# Patient Record
Sex: Female | Born: 1975 | Race: White | Hispanic: No | Marital: Married | Smoking: Never smoker
Health system: Southern US, Community
[De-identification: ages and names within clinical notes are randomized; demographics above are authoritative.]

## PROBLEM LIST (undated history)

## (undated) DIAGNOSIS — G8929 Other chronic pain: Secondary | ICD-10-CM

## (undated) DIAGNOSIS — Z91018 Allergy to other foods: Secondary | ICD-10-CM

## (undated) DIAGNOSIS — E785 Hyperlipidemia, unspecified: Secondary | ICD-10-CM

## (undated) DIAGNOSIS — E7800 Pure hypercholesterolemia, unspecified: Secondary | ICD-10-CM

## (undated) DIAGNOSIS — H8109 Meniere's disease, unspecified ear: Secondary | ICD-10-CM

## (undated) DIAGNOSIS — M7989 Other specified soft tissue disorders: Secondary | ICD-10-CM

## (undated) DIAGNOSIS — G4730 Sleep apnea, unspecified: Secondary | ICD-10-CM

## (undated) DIAGNOSIS — D649 Anemia, unspecified: Secondary | ICD-10-CM

## (undated) DIAGNOSIS — R519 Headache, unspecified: Secondary | ICD-10-CM

## (undated) HISTORY — DX: Meniere's disease, unspecified ear: H81.09

## (undated) HISTORY — DX: Pure hypercholesterolemia, unspecified: E78.00

## (undated) HISTORY — DX: Allergy to other foods: Z91.018

## (undated) HISTORY — DX: Anemia, unspecified: D64.9

## (undated) HISTORY — DX: Headache, unspecified: R51.9

## (undated) HISTORY — DX: Other specified soft tissue disorders: M79.89

## (undated) HISTORY — DX: Hyperlipidemia, unspecified: E78.5

## (undated) HISTORY — DX: Other chronic pain: G89.29

## (undated) HISTORY — PX: WISDOM TOOTH EXTRACTION: SHX21

## (undated) HISTORY — DX: Sleep apnea, unspecified: G47.30

---

## 2002-11-16 ENCOUNTER — Other Ambulatory Visit: Admission: RE | Admit: 2002-11-16 | Discharge: 2002-11-16 | Payer: Self-pay | Admitting: Gynecology

## 2005-02-27 ENCOUNTER — Emergency Department (HOSPITAL_COMMUNITY): Admission: EM | Admit: 2005-02-27 | Discharge: 2005-02-27 | Payer: Self-pay | Admitting: Emergency Medicine

## 2006-08-15 ENCOUNTER — Encounter: Admission: RE | Admit: 2006-08-15 | Discharge: 2006-08-15 | Payer: Self-pay | Admitting: Internal Medicine

## 2009-07-14 ENCOUNTER — Ambulatory Visit: Payer: Self-pay | Admitting: Internal Medicine

## 2011-02-26 ENCOUNTER — Encounter: Payer: Self-pay | Admitting: Internal Medicine

## 2014-05-17 ENCOUNTER — Other Ambulatory Visit: Payer: 59 | Admitting: Internal Medicine

## 2014-05-17 DIAGNOSIS — Z0000 Encounter for general adult medical examination without abnormal findings: Secondary | ICD-10-CM

## 2014-05-17 DIAGNOSIS — Z1329 Encounter for screening for other suspected endocrine disorder: Secondary | ICD-10-CM

## 2014-05-17 DIAGNOSIS — Z13220 Encounter for screening for lipoid disorders: Secondary | ICD-10-CM

## 2014-05-17 DIAGNOSIS — Z1321 Encounter for screening for nutritional disorder: Secondary | ICD-10-CM

## 2014-05-17 DIAGNOSIS — Z130 Encounter for screening for diseases of the blood and blood-forming organs and certain disorders involving the immune mechanism: Secondary | ICD-10-CM

## 2014-05-17 LAB — COMPREHENSIVE METABOLIC PANEL
ALBUMIN: 3.9 g/dL (ref 3.5–5.2)
ALK PHOS: 80 U/L (ref 39–117)
ALT: 21 U/L (ref 0–35)
AST: 21 U/L (ref 0–37)
BUN: 12 mg/dL (ref 6–23)
CALCIUM: 9.2 mg/dL (ref 8.4–10.5)
CHLORIDE: 102 meq/L (ref 96–112)
CO2: 29 mEq/L (ref 19–32)
CREATININE: 0.71 mg/dL (ref 0.50–1.10)
GLUCOSE: 79 mg/dL (ref 70–99)
POTASSIUM: 4.2 meq/L (ref 3.5–5.3)
Sodium: 137 mEq/L (ref 135–145)
TOTAL PROTEIN: 7.2 g/dL (ref 6.0–8.3)
Total Bilirubin: 1.3 mg/dL — ABNORMAL HIGH (ref 0.2–1.2)

## 2014-05-17 LAB — LIPID PANEL
CHOL/HDL RATIO: 5.4 ratio
CHOLESTEROL: 199 mg/dL (ref 0–200)
HDL: 37 mg/dL — ABNORMAL LOW (ref >39)
LDL CALC: 139 mg/dL — AB (ref 0–99)
Triglycerides: 116 mg/dL (ref <150)
VLDL: 23 mg/dL (ref 0–40)

## 2014-05-17 LAB — CBC WITH DIFFERENTIAL/PLATELET
BASOS ABS: 0 10*3/uL (ref 0.0–0.1)
Basophils Relative: 0 % (ref 0–1)
EOS PCT: 1 % (ref 0–5)
Eosinophils Absolute: 0.1 10*3/uL (ref 0.0–0.7)
HEMATOCRIT: 40.6 % (ref 36.0–46.0)
Hemoglobin: 13.1 g/dL (ref 12.0–15.0)
LYMPHS ABS: 2.4 10*3/uL (ref 0.7–4.0)
Lymphocytes Relative: 32 % (ref 12–46)
MCH: 30 pg (ref 26.0–34.0)
MCHC: 32.3 g/dL (ref 30.0–36.0)
MCV: 92.9 fL (ref 78.0–100.0)
MPV: 9.5 fL (ref 8.6–12.4)
Monocytes Absolute: 0.5 10*3/uL (ref 0.1–1.0)
Monocytes Relative: 6 % (ref 3–12)
NEUTROS PCT: 61 % (ref 43–77)
Neutro Abs: 4.6 10*3/uL (ref 1.7–7.7)
PLATELETS: 306 10*3/uL (ref 150–400)
RBC: 4.37 MIL/uL (ref 3.87–5.11)
RDW: 13.5 % (ref 11.5–15.5)
WBC: 7.6 10*3/uL (ref 4.0–10.5)

## 2014-05-18 LAB — TSH: TSH: 2.372 u[IU]/mL (ref 0.350–4.500)

## 2014-05-18 LAB — VITAMIN D 25 HYDROXY (VIT D DEFICIENCY, FRACTURES): Vit D, 25-Hydroxy: 25 ng/mL — ABNORMAL LOW (ref 30–100)

## 2014-05-19 ENCOUNTER — Encounter: Payer: Self-pay | Admitting: Internal Medicine

## 2014-05-19 ENCOUNTER — Ambulatory Visit (INDEPENDENT_AMBULATORY_CARE_PROVIDER_SITE_OTHER): Payer: 59 | Admitting: Internal Medicine

## 2014-05-19 VITALS — BP 128/84 | HR 83 | Temp 98.7°F | Wt 233.0 lb

## 2014-05-19 DIAGNOSIS — E7800 Pure hypercholesterolemia, unspecified: Secondary | ICD-10-CM

## 2014-05-19 DIAGNOSIS — E559 Vitamin D deficiency, unspecified: Secondary | ICD-10-CM | POA: Diagnosis not present

## 2014-05-19 DIAGNOSIS — R0683 Snoring: Secondary | ICD-10-CM

## 2014-05-19 DIAGNOSIS — Z23 Encounter for immunization: Secondary | ICD-10-CM

## 2014-05-19 MED ORDER — TETANUS-DIPHTH-ACELL PERTUSSIS 5-2.5-18.5 LF-MCG/0.5 IM SUSP
0.5000 mL | Freq: Once | INTRAMUSCULAR | Status: AC
  Administered 2014-05-19: 0.5 mL via INTRAMUSCULAR

## 2014-06-13 ENCOUNTER — Encounter: Payer: Self-pay | Admitting: Internal Medicine

## 2014-06-13 NOTE — Patient Instructions (Signed)
Take 2000 units vitamin D 3 daily for vitamin D deficiency. Watch diet and exercise. Repeat lipid panel in one year.

## 2014-06-13 NOTE — Progress Notes (Signed)
   Subjective:    Patient ID: Kara Wilkinson, female    DOB: [birth year], 39 y.o.   MRN: [id number]  HPI  39 year old white female, formerly known to this office as Kara Wilkinson, presents to the office for the first time today since 2011. She has been seeing OB/GYN nurse practitioner, Sherolyn Buba. She has an IUD.  Amoxicillin causes hives. Nickel causes a rash.  No pregnancies.  No history of serious illnesses accidents or operations. No hospitalizations.  Social history: She is married to [name] none. 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 Does not smoke or consume alcohol. Husband has history of end-stage renal disease and has undergone transplant with kidney and pancreas.  Family history: Father age 38 with history of end-stage renal disease, diabetes, macular degeneration, stroke. Mother in good health. One sister age 90 with history of lupus and narcolepsy. One sister age 16 with arthritis.  Initially presented to the office in November 4445 is a 39 year old senior at [employer]. Has been seen mostly for respiratory infections through the years. Seen for for vasovagal syncope in April 1998.  History of fractured left index finger 1990.      Review of Systems  Constitutional: Negative.   HENT: Negative.   Eyes: Negative.   Respiratory: Negative.   Cardiovascular: Negative.   Genitourinary:       Menarche at age 57. Has IUD  Neurological: Negative.   Hematological: Negative.   Psychiatric/Behavioral: Negative.        Objective:   Physical Exam  Constitutional: She is oriented to person, place, and time. She appears well-developed and well-nourished. No distress.  HENT:  Head: Normocephalic and atraumatic.  Right Ear: External ear normal.  Left Ear: External ear normal.  Nose: Nose normal.  Mouth/Throat: Oropharynx is clear and moist. No oropharyngeal exudate.  Eyes: Conjunctivae and  EOM are normal. Right eye exhibits no discharge. Left eye exhibits no discharge. No scleral icterus.  Neck: Neck supple. No JVD present. No tracheal deviation present. No thyromegaly present.  Cardiovascular: Normal rate, normal heart sounds and intact distal pulses.   No murmur heard. Pulmonary/Chest: Breath sounds normal. No respiratory distress. She has no wheezes.  Abdominal: Soft. Bowel sounds are normal. She exhibits no distension and no mass. There is no tenderness. There is no rebound and no guarding.  Genitourinary:  Deferred to GYN  Musculoskeletal: She exhibits no edema.  Lymphadenopathy:    She has no cervical adenopathy.  Neurological: She is alert and oriented to person, place, and time. She has normal reflexes. No cranial nerve deficit.  Skin: No rash noted. She is not diaphoretic.  Psychiatric: She has a normal mood and affect. Her behavior is normal. Judgment and thought content normal.  Vitals reviewed.         Assessment & Plan:  Normal health maintenance exam  Vitamin D deficiency  Elevated LDL cholesterol  History of snoring-may have sleep evaluation with pulmonologist  Tetanus immunization update given today  Plan: Work on diet and exercise and recheck lipid panel in one year. Take 2000 units vitamin D 3 daily for vitamin D deficiency. Return in one year or as needed.

## 2014-06-14 ENCOUNTER — Encounter: Payer: Self-pay | Admitting: Internal Medicine

## 2014-07-13 ENCOUNTER — Institutional Professional Consult (permissible substitution): Payer: 59 | Admitting: Pulmonary Disease

## 2014-09-08 ENCOUNTER — Ambulatory Visit (INDEPENDENT_AMBULATORY_CARE_PROVIDER_SITE_OTHER): Payer: 59 | Admitting: Pulmonary Disease

## 2014-09-08 ENCOUNTER — Encounter: Payer: Self-pay | Admitting: Pulmonary Disease

## 2014-09-08 VITALS — BP 138/82 | HR 81 | Ht 65.0 in | Wt 239.0 lb

## 2014-09-08 DIAGNOSIS — G4710 Hypersomnia, unspecified: Secondary | ICD-10-CM | POA: Diagnosis not present

## 2014-09-08 NOTE — Patient Instructions (Signed)
Sleep study overnight followed by nap test

## 2014-09-08 NOTE — Progress Notes (Signed)
   Subjective:    Patient ID: Kara Wilkinson, female    DOB: [birth year], 39 y.o.   MRN: [id number]  HPI  39 year old referred for evaluation of sleep-disordered breathing. She reports excessive daytime somnolence Epworth sleepiness score is 19-she report sleepiness and radius social situations such as watching TV, passenger in a car or sitting inactive in a public place. Her dad has obstructive sleep apnea and an older sister has narcolepsy. No apneas have been witnessed by her husband, she has been told that she snores mildly. She will take a nap for a few hours on weekends. Bedtime is around 10:30 PM, sleep latency is minimal, sleeps on her side with one pillow, reports one nocturnal awakening for nocturia without any post void sleep latency and is out of bed at 6 AM with headaches and occasional dryness of mouth. She's gained 15 pounds in the last 2 years. There is no history suggestive of cataplexy, sleep paralysis or parasomnias She works at [employer] as a Engineer, mining   History reviewed. No pertinent past medical history.  History reviewed. No pertinent past surgical history.  Allergies  Allergen Reactions  . Amoxicillin Hives    History   Social History  . Marital Status: Married    Spouse Name: N/A  . Number of Children: N/A  . Years of Education: N/A   Occupational History  . Not on file.   Social History Main Topics  . Smoking status: Never Smoker   . Smokeless tobacco: Not on file  . Alcohol Use: Not on file  . Drug Use: Not on file  . Sexual Activity: Not on file   Other Topics Concern  . Not on file   Social History Narrative    Family History  Problem Relation Age of Onset  . Kidney disease Father   . Diabetes Father     Review of Systems  Constitutional: Positive for fatigue and unexpected weight change.   Eyes: negative for irritation, redness and visual disturbance  Ears, nose, mouth, throat, and face: negative for earaches,  epistaxis, nasal congestion and sore throat  Respiratory: negative for cough, dyspnea on exertion, sputum and wheezing  Cardiovascular: negative for chest pain, dyspnea, lower extremity edema, orthopnea, palpitations and syncope  Gastrointestinal: negative for abdominal pain, constipation, diarrhea, melena, nausea and vomiting  Genitourinary:negative for dysuria, frequency and hematuria  Hematologic/lymphatic: negative for bleeding, easy bruising and lymphadenopathy  Musculoskeletal:negative for arthralgias, muscle weakness and stiff joints  Neurological: negative for coordination problems, gait problems, headaches and weakness  Endocrine: negative for diabetic symptoms including polydipsia, polyuria and weight loss      Objective:   Physical Exam  Gen. Pleasant, obese, in no distress, normal affect ENT - no lesions, no post nasal drip, class 2-3 airway Neck: No JVD, no thyromegaly, no carotid bruits Lungs: no use of accessory muscles, no dullness to percussion, decreased without rales or rhonchi  Cardiovascular: Rhythm regular, heart sounds  normal, no murmurs or gallops, no peripheral edema Abdomen: soft and non-tender, no hepatosplenomegaly, BS normal. Musculoskeletal: No deformities, no cyanosis or clubbing Neuro:  alert, non focal, no tremors       Assessment & Plan:

## 2014-09-08 NOTE — Assessment & Plan Note (Addendum)
Given excessive daytime somnolence, narrow pharyngeal exam, witnessed apneas & loud snoring, obstructive sleep apnea is very likely & an overnight polysomnogram will be scheduled as a split study. The pathophysiology of obstructive sleep apnea , it's cardiovascular consequences & modes of treatment including CPAP were discused with the patient in detail & they evidenced understanding. Given family h/o narcolepsy & h/o vivid dreams even with naps - will also plan for MSLT after.  She does not seem to have cataplexy or sleep paralysis

## 2014-11-07 ENCOUNTER — Telehealth: Payer: Self-pay | Admitting: Pulmonary Disease

## 2014-11-07 NOTE — Telephone Encounter (Signed)
Spoke with [facility] Need to know if need for sleep study is for DOT purposes - NO Does the patient have any cardiovascular issues being the reason why split night and MSLT was ordered? - NO, nothing mentioned in chart.  Per [facility] rep, the patient will need a HST first - no pre-cert required. This is required before any other sleep test can be done based on her insurance and medical history documented.  Will send to Dr Elsworth Soho as Juluis Rainier.  Please advise. Thanks.

## 2014-11-07 NOTE — Telephone Encounter (Signed)
Given family h/o narcolepsy & h/o vivid dreams even with naps - will also plan for MSLT  Pl let [employer] rep know -concern here is for narcolepsy - so prefer in lab study

## 2014-11-07 NOTE — Telephone Encounter (Signed)
Return call.Stanley A Dalton °

## 2014-11-07 NOTE — Telephone Encounter (Signed)
lmtcb

## 2014-11-10 NOTE — Telephone Encounter (Signed)
TE has been closed ......................................... lmtcb.

## 2014-11-20 ENCOUNTER — Ambulatory Visit (HOSPITAL_BASED_OUTPATIENT_CLINIC_OR_DEPARTMENT_OTHER): Payer: 59 | Attending: Pulmonary Disease | Admitting: Radiology

## 2014-11-20 VITALS — Ht 65.0 in | Wt 234.0 lb

## 2014-11-20 DIAGNOSIS — R0683 Snoring: Secondary | ICD-10-CM | POA: Diagnosis not present

## 2014-11-20 DIAGNOSIS — G4710 Hypersomnia, unspecified: Secondary | ICD-10-CM

## 2014-11-20 DIAGNOSIS — G4733 Obstructive sleep apnea (adult) (pediatric): Secondary | ICD-10-CM | POA: Diagnosis not present

## 2014-11-20 DIAGNOSIS — I493 Ventricular premature depolarization: Secondary | ICD-10-CM | POA: Diagnosis not present

## 2014-11-20 DIAGNOSIS — R400 Somnolence: Secondary | ICD-10-CM | POA: Diagnosis present

## 2014-11-21 ENCOUNTER — Encounter (HOSPITAL_BASED_OUTPATIENT_CLINIC_OR_DEPARTMENT_OTHER): Payer: 59

## 2014-11-23 ENCOUNTER — Telehealth: Payer: Self-pay | Admitting: Pulmonary Disease

## 2014-11-23 DIAGNOSIS — G4733 Obstructive sleep apnea (adult) (pediatric): Secondary | ICD-10-CM

## 2014-11-23 DIAGNOSIS — G4730 Sleep apnea, unspecified: Secondary | ICD-10-CM | POA: Diagnosis not present

## 2014-11-23 NOTE — Progress Notes (Signed)
Patient Name: Kara Wilkinson, Kara Wilkinson Date: 11/20/2014 Gender: Female D.O.B: 05-21-1975 Age (years): 71 Referring Provider: Kara Mead MD, ABSM Height (inches): 65 Interpreting Physician: Kara Mead MD, ABSM Weight (lbs): 234 RPSGT: Zadie Rhine BMI: 39 MRN: [id number] Neck Size: 14.50  CLINICAL INFORMATION Sleep Study Type: Split Night CPAP  Indication for sleep study: Excessive daytime somnolence, loud snoring and witnessed apneas  Epworth Sleepiness Score: 19  SLEEP STUDY TECHNIQUE As per the AASM Manual for the Scoring of Sleep and Associated Events v2.3 (April 2016) with a hypopnea requiring 4% desaturations.  The channels recorded and monitored were frontal, central and occipital EEG, electrooculogram (EOG), submentalis EMG (chin), nasal and oral airflow, thoracic and abdominal wall motion, anterior tibialis EMG, snore microphone, electrocardiogram, and pulse oximetry. Continuous positive airway pressure (CPAP) was initiated when the patient met split night criteria and was titrated according to treat sleep-disordered breathing.  MEDICATIONS Medications administered by patient during sleep study : No sleep medicine administered. RESPIRATORY PARAMETERS Diagnostic  Total AHI (/hr): 108.7 RDI (/hr): 113.8 OA Index (/hr): 52.1 CA Index (/hr): 0.6 REM AHI (/hr): 110.0 NREM AHI (/hr): 108.6 Supine AHI (/hr): 115.1 Non-supine AHI (/hr): 99.78 Min O2 Sat (%): 77.00 Mean O2 (%): 92.92 Time below 88% (min): 7.8   Titration  Optimal Pressure (cm): 10 AHI at Optimal Pressure (/hr): N/A Min O2 at Optimal Pressure (%): 89.00 Supine % at Optimal (%): N/A Sleep % at Optimal (%): N/A    SLEEP ARCHITECTURE The recording time for the entire night was 381.5 minutes.  During a baseline period of 127.2 minutes, the patient slept for 106.0 minutes in REM and nonREM, yielding a sleep efficiency of 83.4%. Sleep onset after lights out was 6.4 minutes with a REM latency of 56.0 minutes. The  patient spent 4.25% of the night in stage N1 sleep, 90.09% in stage N2 sleep, 0.00% in stage N3 and 5.66% in REM.  During the titration period of 251.8 minutes, the patient slept for 238.0 minutes in REM and nonREM, yielding a sleep efficiency of 94.5%. Sleep onset after CPAP initiation was 11.2 minutes with a REM latency of 16.0 minutes. The patient spent 0.84% of the night in stage N1 sleep, 24.58% in stage N2 sleep, 35.92% in stage N3 and 38.66% in REM.  CARDIAC DATA The 2 lead EKG demonstrated sinus rhythm. The mean heart rate was 64.75 beats per minute. Other EKG findings include: PVCs.  LEG MOVEMENT DATA The total Periodic Limb Movements of Sleep (PLMS) were 0. The PLMS index was 0.00 .  IMPRESSIONS Severe obstructive sleep apnea occurred during the diagnostic portion of the study (AHI = 108.7/hour). An optimal PAP pressure was selected for this patient ( 10 cm of water) No significant central sleep apnea occurred during the diagnostic portion of the study (CAI = 0.6/hour). Mild oxygen desaturation was noted during the diagnostic portion of the study (Min O2 = 77.00%). The patient snored with Soft snoring volume during the diagnostic portion of the study. EKG findings include PVCs. Clinically significant periodic limb movements did not occur during sleep.   DIAGNOSIS Obstructive Sleep Apnea (327.23 [G47.33 ICD-10])   RECOMMENDATIONS Trial of CPAP therapy on 10 cm H2O with a Small size Resmed Full Face Mask Quattro FX mask and heated humidification. Avoid alcohol, sedatives and other CNS depressants that may worsen sleep apnea and disrupt normal sleep architecture. Sleep hygiene should be reviewed to assess factors that may improve sleep quality. Weight management and regular exercise should be initiated or continued. Return  for re-evaluation after 4 weeks of therapy    Kara Mead MD. FCCP. [facility]

## 2014-11-23 NOTE — Telephone Encounter (Signed)
PSG showed severe OSA-AHI of 108/hour  Send prescription for - CPAP therapy on 10 cm H2O with a Small size Resmed Full Face Mask Quattro FX mask and heated humidification Download in 4 weeks  Schedule office visit in 6 weeks with TP/ me

## 2014-11-23 NOTE — Addendum Note (Signed)
Addended by: Rigoberto Noel on: 11/23/2014 05:40 PM   Modules accepted: Level of Service

## 2014-11-24 NOTE — Telephone Encounter (Signed)
lmtcb

## 2014-11-25 NOTE — Telephone Encounter (Signed)
lmtcb

## 2014-11-29 NOTE — Telephone Encounter (Signed)
[phone], pt cb

## 2014-11-29 NOTE — Telephone Encounter (Signed)
LMTCB x 1 

## 2014-11-30 NOTE — Telephone Encounter (Signed)
lmtcb

## 2014-12-02 NOTE — Telephone Encounter (Signed)
Called spoke with pt. Aware of results. Order placed for CPAP. appt scheduled for 10/6 w/ RA.

## 2014-12-02 NOTE — Telephone Encounter (Signed)
lmtcb

## 2014-12-02 NOTE — Telephone Encounter (Signed)
Pt returning call and can be reached [phone].Kara Wilkinson

## 2014-12-02 NOTE — Telephone Encounter (Signed)
[phone] calling back

## 2014-12-02 NOTE — Telephone Encounter (Signed)
lmomtcb x1 

## 2015-01-19 ENCOUNTER — Ambulatory Visit: Payer: 59 | Admitting: Pulmonary Disease

## 2015-05-22 ENCOUNTER — Other Ambulatory Visit: Payer: 59 | Admitting: Internal Medicine

## 2015-05-23 ENCOUNTER — Encounter: Payer: 59 | Admitting: Internal Medicine

## 2017-05-11 ENCOUNTER — Emergency Department (HOSPITAL_BASED_OUTPATIENT_CLINIC_OR_DEPARTMENT_OTHER)
Admission: EM | Admit: 2017-05-11 | Discharge: 2017-05-11 | Disposition: A | Discharge status: Home / Self Care | Payer: 59 | Attending: Emergency Medicine | Admitting: Emergency Medicine

## 2017-05-11 ENCOUNTER — Other Ambulatory Visit: Payer: Self-pay

## 2017-05-11 ENCOUNTER — Emergency Department (HOSPITAL_BASED_OUTPATIENT_CLINIC_OR_DEPARTMENT_OTHER): Payer: 59

## 2017-05-11 ENCOUNTER — Encounter (HOSPITAL_BASED_OUTPATIENT_CLINIC_OR_DEPARTMENT_OTHER): Payer: Self-pay | Admitting: Adult Health

## 2017-05-11 DIAGNOSIS — Z79899 Other long term (current) drug therapy: Secondary | ICD-10-CM | POA: Insufficient documentation

## 2017-05-11 DIAGNOSIS — S80212A Abrasion, left knee, initial encounter: Secondary | ICD-10-CM | POA: Diagnosis not present

## 2017-05-11 DIAGNOSIS — Y9301 Activity, walking, marching and hiking: Secondary | ICD-10-CM | POA: Diagnosis not present

## 2017-05-11 DIAGNOSIS — W108XXA Fall (on) (from) other stairs and steps, initial encounter: Secondary | ICD-10-CM | POA: Diagnosis not present

## 2017-05-11 DIAGNOSIS — S80211A Abrasion, right knee, initial encounter: Secondary | ICD-10-CM | POA: Diagnosis not present

## 2017-05-11 DIAGNOSIS — Y9222 Religious institution as the place of occurrence of the external cause: Secondary | ICD-10-CM | POA: Diagnosis not present

## 2017-05-11 DIAGNOSIS — Y998 Other external cause status: Secondary | ICD-10-CM | POA: Diagnosis not present

## 2017-05-11 DIAGNOSIS — M79641 Pain in right hand: Secondary | ICD-10-CM | POA: Insufficient documentation

## 2017-05-11 DIAGNOSIS — S93401A Sprain of unspecified ligament of right ankle, initial encounter: Secondary | ICD-10-CM | POA: Diagnosis not present

## 2017-05-11 DIAGNOSIS — S99911A Unspecified injury of right ankle, initial encounter: Secondary | ICD-10-CM | POA: Diagnosis present

## 2017-05-11 DIAGNOSIS — W19XXXA Unspecified fall, initial encounter: Secondary | ICD-10-CM

## 2017-05-11 MED ORDER — METHOCARBAMOL 500 MG PO TABS: 500.0000 mg | ORAL_TABLET | Freq: Two times a day (BID) | ORAL | 0 refills | Status: DC

## 2017-05-11 MED ORDER — NAPROXEN 500 MG PO TABS: 500.0000 mg | ORAL_TABLET | Freq: Two times a day (BID) | ORAL | 0 refills | Status: DC

## 2017-05-11 MED ORDER — IBUPROFEN 400 MG PO TABS
600.0000 mg | ORAL_TABLET | Freq: Once | ORAL | Status: AC
  Administered 2017-05-11: 600 mg via ORAL
  Filled 2017-05-11: qty 1

## 2017-05-11 NOTE — ED Triage Notes (Signed)
PResents post fall down 7 stairs at church this AM. C/o pain to tright ankle and right hand. RIght ankle swollen, CMS intact. PAin is worse on the back of heel and ankle.

## 2017-05-11 NOTE — Discharge Instructions (Signed)
Your x-rays show no evidence of acute fracture.  You may need to use Naprosyn and Tylenol for pain, you may have more muscle soreness tomorrow, if you have muscle spasm, you may use Robaxin, this medication can make you a bit drowsy.  Keep ankle elevated as much as possible, use crutches and brace.  Use splint for finger to help prevent you from bending it.  If pain is not improving after a few days of this treatment please follow-up with your primary care doctor as well as Dr. Karlton Lemon here with sports medicine.  If you have significantly worsening pain, numbness or tingling in any of your extremities or other new or concerning symptoms please return to the ED for reevaluation.

## 2017-05-11 NOTE — ED Provider Notes (Signed)
[facility] EMERGENCY DEPARTMENT Provider Note   CSN: [id number] Arrival date & time: 05/11/17  1140     History   Chief Complaint Chief Complaint  Patient presents with  . Fall    HPI Kara Wilkinson is a 42 y.o. female.  Kara Wilkinson is a 42 y.o. Female who is otherwise healthy, presents to the ED after mechanical fall this morning.  Patient reports that she missed a step, and fell down 7 stairs at church this morning. Patient denies hitting her head, no loss of consciousness, headache, dizziness, nausea, vomiting or neck pain.  Patient complaining primarily of right ankle pain and pain over the right index finger extending into the hand.  Patient reports abrasions to both knees, but needs otherwise unremarkable.  Patient denies any pain at the hips, no pain over the shoulders, elbow or wrist.  Patient denies any chest pain, shortness of breath or pain with deep breath, no abdominal pain.  Patient has been ambulatory since falling, but reports pain with weightbearing on the right ankle.  Patient denies any back pain, numbness, tingling or weakness of any of her extremities.       History reviewed. No pertinent past medical history.  Patient Active Problem List   Diagnosis Date Noted  . Hypersomnolence 09/08/2014    History reviewed. No pertinent surgical history.  OB History    No data available       Home Medications    Prior to Admission medications   Medication Sig Start Date End Date Taking? Authorizing Provider  levonorgestrel (MIRENA) 20 MCG/24HR IUD 1 each by Intrauterine route once.    [provider]  methocarbamol (ROBAXIN) 500 MG tablet Take 1 tablet (500 mg total) by mouth 2 (two) times daily. 05/11/17   Jacqlyn Larsen, PA-C  naproxen (NAPROSYN) 500 MG tablet Take 1 tablet (500 mg total) by mouth 2 (two) times daily. 05/11/17   Jacqlyn Larsen, PA-C    Family History Family History  Problem Relation Age of Onset  . Kidney disease  Father   . Diabetes Father     Social History Social History   Tobacco Use  . Smoking status: Never Smoker  Substance Use Topics  . Alcohol use: Not on file  . Drug use: Not on file     Allergies   Amoxicillin   Review of Systems Review of Systems  Constitutional: Negative for chills, fatigue and fever.  HENT: Negative for ear pain and facial swelling.   Eyes: Negative for photophobia and visual disturbance.  Respiratory: Negative for chest tightness and shortness of breath.   Cardiovascular: Negative for chest pain and palpitations.  Gastrointestinal: Negative for abdominal distention, abdominal pain, nausea and vomiting.  Genitourinary: Negative for flank pain.  Musculoskeletal: Positive for arthralgias, joint swelling and myalgias. Negative for back pain, neck pain and neck stiffness.  Skin: Positive for wound (Bilateral knee abrasions). Negative for rash.  Neurological: Negative for dizziness, weakness, light-headedness, numbness and headaches.     Physical Exam Updated Vital Signs BP (!) 145/86 (BP Location: Left Arm)   Pulse 79   Temp 98.7 F (37.1 C) (Oral)   Resp 20   Wt 106.1 kg (234 lb)   LMP 05/08/2017   SpO2 100%   BMI 38.94 kg/m   Physical Exam  Constitutional: She appears well-developed and well-nourished. No distress.  HENT:  Head: Normocephalic and atraumatic.  Eyes: Right eye exhibits no discharge. Left eye exhibits no discharge.  Neck: Normal  range of motion. Neck supple.  C-spine nontender to palpation midline or paraspinally, normal active range of motion  Cardiovascular: Normal rate, regular rhythm, normal heart sounds and intact distal pulses.  Pulmonary/Chest: Effort normal and breath sounds normal. No stridor. No respiratory distress. She has no wheezes. She has no rales. She exhibits no tenderness.  Abdominal: Soft. Bowel sounds are normal. She exhibits no distension. There is no tenderness.  Musculoskeletal:  Right ankle with pain  and swelling over the lateral malleolus, nontender over the medial malleolus, no obvious deformity, dorsi and plantar flexion limited by pain, 2+ DP and TP pulses, sensation intact. Left ankle with minimal pain and swelling over the lateral malleolus, no pain elsewhere, normal dorsi and plantarflexion, DP and TP pulses 2+ and sensation intact. Bilateral knees with small abrasion, normal flexion and extension, nontender to palpation. Right hand with some tenderness over the index finger and MTP joint, no palpable deformity, normal flexion and extension slightly limited by pain, good capillary refill, 2+ radial pulse, normal wrist range of motion and sensation intact.  Shoulders and elbows unremarkable.  Neurological: She is alert. Coordination normal.  Skin: Skin is warm and dry. She is not diaphoretic.  Bilateral some surface abrasions to the knee, no other wounds or abrasions noted  Psychiatric: She has a normal mood and affect. Her behavior is normal.  Nursing note and vitals reviewed.    ED Treatments / Results  Labs (all labs ordered are listed, but only abnormal results are displayed) Labs Reviewed - No data to display  EKG  EKG Interpretation None       Radiology Dg Ankle Complete Right  Result Date: 05/11/2017 CLINICAL DATA:  [place] 7 stairs at church this morning, RIGHT ankle and RIGHT hand pain, swelling to entire ankle, pain in index finger radiating into first metacarpal region, initial encounter EXAM: RIGHT ANKLE - COMPLETE 3+ VIEW COMPARISON:  None FINDINGS: Diffuse soft tissue swelling. Ankle mortise intact. Osseous mineralization normal. No acute fracture, dislocation, or bone destruction. IMPRESSION: No acute osseous abnormalities. Electronically Signed   By: Lavonia Dana M.D.   On: 05/11/2017 12:33   Dg Hand Complete Right  Result Date: 05/11/2017 CLINICAL DATA:  [place] 7 stairs at church this morning, RIGHT ankle and RIGHT hand pain, swelling to entire ankle,  pain in index finger radiating into first metacarpal region, initial encounter EXAM: RIGHT HAND - COMPLETE 3+ VIEW COMPARISON:  None. FINDINGS: Osseous mineralization normal. Joint spaces preserved. No acute fracture, dislocation or bone destruction. IMPRESSION: Normal exam. Electronically Signed   By: Lavonia Dana M.D.   On: 05/11/2017 12:36    Procedures Procedures (including critical care time)  Medications Ordered in ED Medications  ibuprofen (ADVIL,MOTRIN) tablet 600 mg (600 mg Oral Given 05/11/17 1413)     Initial Impression / Assessment and Plan / ED Course  I have reviewed the triage vital signs and the nursing notes.  Pertinent labs & imaging results that were available during my care of the patient were reviewed by me and considered in my medical decision making (see chart for details).  Patient presents for evaluation after mechanical fall this morning.  No head trauma, no loss of consciousness.  Patient complaining primarily of right ankle and right hand pain.  Patient reports some generalized muscle soreness, abrasions to bilateral knees, but no other concerning injuries.  On exam patient is mildly hypertensive but vitals otherwise normal.  Right ankle with swelling over the lateral malleolus, neurovascularly intact and x-ray  without fracture, presentation suggestive of ankle sprain, will provide ASO brace and crutches.  Right hand with pain over the index finger and MTP joint, no obvious deformity, neurovascularly intact, x-ray without abnormality, likely ligamentous sprain, will place in splint for protection.  No evidence of other exam, typical muscle soreness after a fall, and abrasions to bilateral knees, with normal range of motion.  Ibuprofen for pain.  At this time patient is stable for discharge home, discussed rice therapy for injuries.  Also provided patient with small amount of muscle relaxer if she may have worsening soreness tomorrow.  Strict return precautions discussed.   Patient to follow-up with your primary care doctor, also provided referral to Dr. Karlton Lemon with sports medicine if pain in the ankle or hand persists.  Patient provided work note.  At this time patient has no further questions, expresses understanding and is in agreement with plan.  Final Clinical Impressions(s) / ED Diagnoses   Final diagnoses:  Fall, initial encounter  Sprain of right ankle, unspecified ligament, initial encounter  Right hand pain    ED Discharge Orders        Ordered    naproxen (NAPROSYN) 500 MG tablet  2 times daily     05/11/17 1421    methocarbamol (ROBAXIN) 500 MG tablet  2 times daily     05/11/17 1421       Jacqlyn Larsen, [place] 05/11/17 1751    Davonna Belling, MD 05/12/17 2313

## 2017-07-17 ENCOUNTER — Telehealth: Payer: Self-pay

## 2017-07-17 ENCOUNTER — Encounter: Payer: Self-pay | Admitting: Internal Medicine

## 2017-07-17 ENCOUNTER — Ambulatory Visit (INDEPENDENT_AMBULATORY_CARE_PROVIDER_SITE_OTHER): Payer: 59 | Admitting: Internal Medicine

## 2017-07-17 VITALS — BP 120/82 | HR 77 | Temp 98.2°F | Ht 65.0 in | Wt 260.0 lb

## 2017-07-17 DIAGNOSIS — L309 Dermatitis, unspecified: Secondary | ICD-10-CM | POA: Diagnosis not present

## 2017-07-17 DIAGNOSIS — R21 Rash and other nonspecific skin eruption: Secondary | ICD-10-CM | POA: Diagnosis not present

## 2017-07-17 MED ORDER — METHYLPREDNISOLONE ACETATE 80 MG/ML IJ SUSP
80.0000 mg | Freq: Once | INTRAMUSCULAR | Status: AC
  Administered 2017-07-17: 80 mg via INTRAMUSCULAR

## 2017-07-17 MED ORDER — HYDROXYZINE HCL 25 MG PO TABS: ORAL_TABLET | ORAL | 0 refills | Status: DC

## 2017-07-17 NOTE — Progress Notes (Signed)
   Subjective:    Patient ID: Kara Wilkinson, female    DOB: 1975/11/11, 42 y.o.   MRN: [id number]  HPI 42 year old Female with history itchy rash right dorsum of hand and arm for several weeks. No known bites, travel history, flea exposure or contact exposure to poison ivy or change in detergents creams, etc.  Tried Benadryl but it made her very sleepy. Has seen Dr. Crista Luria in the remote past.  Last seen here Feb 2016. Hx of nicklel causing a rash. Amoxicillin causes hives. No hx serious illness, accidents, or operations. Married works for [employer].    Review of Systems no recent viral illness like flu. No fever chills, N,V,D.     Objective:   Physical Exam She has tiny red papules on dorsum of hand and right arm with a vesicular center. Has some on LUE and legs as well. Pharynx is clear.A few on upper abdomen/ anterior chest. Area of linear erythema dorsum of hand.       Assessment & Plan:  Nonspecific dermatitis  Plan: Treat symptomatically with Atarax 25 -50 mg qid prn itching. Triamcinolone cream in 4 oz Eucerin cream to apply tid to rash. Refer to Dermatology to check for scabies etc if not improving.  Depo-Medrol 80 mg IM.

## 2017-07-17 NOTE — Telephone Encounter (Signed)
Book appt

## 2017-07-17 NOTE — Patient Instructions (Signed)
Depo-Medrol 80 mg IM.  Triamcinolone with Eucerin topically 3- 4 times a day.  Atarax 25 to  50 mg 3-4 times daily as needed for itching.  See dermatologist if not improving.

## 2017-07-17 NOTE — Telephone Encounter (Signed)
Patient called sates she has a rash on her hand she said it started as a bump and it has gotten worse and it's very itchy she would like to come in today. CB# [phone]

## 2018-05-22 ENCOUNTER — Encounter: Payer: Self-pay | Admitting: Internal Medicine

## 2018-05-22 ENCOUNTER — Ambulatory Visit (INDEPENDENT_AMBULATORY_CARE_PROVIDER_SITE_OTHER): Payer: 59 | Admitting: Internal Medicine

## 2018-05-22 VITALS — BP 110/88 | HR 74 | Temp 98.5°F | Ht 65.0 in | Wt 250.0 lb

## 2018-05-22 DIAGNOSIS — H6500 Acute serous otitis media, unspecified ear: Secondary | ICD-10-CM

## 2018-05-22 DIAGNOSIS — H8110 Benign paroxysmal vertigo, unspecified ear: Secondary | ICD-10-CM | POA: Diagnosis not present

## 2018-05-22 DIAGNOSIS — H9319 Tinnitus, unspecified ear: Secondary | ICD-10-CM

## 2018-05-22 MED ORDER — ALPRAZOLAM 0.25 MG PO TABS: 0.2500 mg | ORAL_TABLET | Freq: Two times a day (BID) | ORAL | 0 refills | Status: DC | PRN

## 2018-05-22 MED ORDER — AZITHROMYCIN 250 MG PO TABS: ORAL_TABLET | ORAL | 0 refills | Status: DC

## 2018-06-13 NOTE — Patient Instructions (Signed)
Take Zithromax Z-PAK as directed.  Take Xanax up to twice daily as needed for dizziness.  Call if not better in 7 to 10 days or sooner if worse.

## 2018-06-13 NOTE — Progress Notes (Signed)
   Subjective:    Patient ID: Winfield Rast, female    DOB: 11-28-1975, 43 y.o.   MRN: [id number]  HPI 43 year old Female in today with complaint of tinnitus and ear fullness.  She has been dizzy recently.  No history of serious illnesses accidents or operations.  She is married and works for [employer].  She is allergic to Amoxicillin- it causes hives.  History of vasovagal syncope April 1998.  Husband with history of end-stage renal disease underwent transplant with kidney and pancreas.  Seen here infrequently.  Sees OB/GYN nurse practitioner.  She has an IUD.  See dictation May 19, 2014  Complains of ear pressure.    Review of Systems main complaint is tinnitus but is also felt dizzy.  Denies fever chills myalgias or sore throat.     Objective:   Physical Exam No nystagmus is demonstrated.  PERRLA.  Pharynx is clear.  TMs are full bilaterally and slightly pink.  Neck is supple without adenopathy.  Brief neurological exam shows no focal deficits.       Assessment & Plan:  Benign positional vertigo-triggered by bilateral serous otitis media  Tinnitus-triggered by bilateral serous otitis media  Bilateral serous otitis media  Plan: Zithromax Z-PAK take 2 tablets day 1 followed by 1 tablet days 2 through 5.  Xanax 0.25 mg twice daily as needed.

## 2019-03-19 ENCOUNTER — Encounter: Payer: Self-pay | Admitting: Internal Medicine

## 2019-03-19 ENCOUNTER — Other Ambulatory Visit: Payer: Self-pay

## 2019-03-19 ENCOUNTER — Ambulatory Visit (INDEPENDENT_AMBULATORY_CARE_PROVIDER_SITE_OTHER): Payer: 59 | Admitting: Internal Medicine

## 2019-03-19 DIAGNOSIS — Z23 Encounter for immunization: Secondary | ICD-10-CM | POA: Diagnosis not present

## 2019-03-19 NOTE — Progress Notes (Signed)
Flu vaccine given by CMA 

## 2019-03-19 NOTE — Patient Instructions (Signed)
Patient received a flu vaccine IM R deltoid, AV, CMA  

## 2019-05-18 ENCOUNTER — Encounter: Payer: Self-pay | Admitting: Internal Medicine

## 2019-05-18 ENCOUNTER — Telehealth: Payer: Self-pay | Admitting: Internal Medicine

## 2019-05-18 ENCOUNTER — Other Ambulatory Visit: Payer: Self-pay

## 2019-05-18 ENCOUNTER — Ambulatory Visit (INDEPENDENT_AMBULATORY_CARE_PROVIDER_SITE_OTHER): Payer: 59 | Admitting: Internal Medicine

## 2019-05-18 VITALS — BP 136/97 | HR 87 | Temp 100.6°F | Ht 65.0 in | Wt 234.6 lb

## 2019-05-18 DIAGNOSIS — U071 COVID-19: Secondary | ICD-10-CM

## 2019-05-18 DIAGNOSIS — R519 Headache, unspecified: Secondary | ICD-10-CM | POA: Diagnosis not present

## 2019-05-18 DIAGNOSIS — J22 Unspecified acute lower respiratory infection: Secondary | ICD-10-CM

## 2019-05-18 DIAGNOSIS — B999 Unspecified infectious disease: Secondary | ICD-10-CM

## 2019-05-18 MED ORDER — PREDNISONE 10 MG PO TABS: ORAL_TABLET | ORAL | 0 refills | Status: DC

## 2019-05-18 MED ORDER — AZITHROMYCIN 250 MG PO TABS: ORAL_TABLET | ORAL | 0 refills | Status: DC

## 2019-05-18 MED ORDER — HYDROCODONE-HOMATROPINE 5-1.5 MG/5ML PO SYRP: 5.0000 mL | ORAL_SOLUTION | Freq: Three times a day (TID) | ORAL | 0 refills | Status: DC | PRN

## 2019-05-18 NOTE — Telephone Encounter (Signed)
Needs virtual visit 

## 2019-05-18 NOTE — Progress Notes (Signed)
   Subjective:    Patient ID: Kara Wilkinson, female    DOB: [birth year], 44 y.o.   MRN: [id number]  HPI 44 year old Female not seen here since February 2020 was diagnosed recently with Covid-19 at [facility]. Covid risk of complications score is 1- she has obesity.  Due to the coronavirus pandemic via interactive audio and video telecommunications.  She is identified using 2 identifiers as Kara Wilkinson, a patient in this practice.  She is agreeable to visit in this format today.  Patient says that she has been careful and has been socially distancing.  However she was just visit her sister who has history of lupus and brother-in-law recently.  Both of them have now come down with COVID-19.  The sister seldom leaves the house so she thinks brother-in-law may have contracted Covid at work.  Patient complaining of headache, myalgias, cough that is nonproductive.  No nausea.  Has decreased appetite.  Trying to stay hydrated.  I recommended patient monitor her temperature on a regular basis and have pulse oximeter device delivered to her doorstep so she can monitor pulse oximetry.  She seems to have a slight congested cough on virtual visit today.  Appetite is decreased.  No diarrhea.  No history of serious illnesses, accidents or hospitalizations. Does not smoke or consume alcohol.  Had G[facility] exam December 2020 at [facility]. New IUD was inserted then.   Review of Systems see above     Objective:   Physical Exam BMI 40.4 per GYN patient reports she is afebrile.  Currently does not have pulse oximeter.  Patient looks fatigued and is speaking slowly.  Heard  congested cough on video.  Seems to have malaise and fatigue.       Assessment & Plan:  Covid-19 infection  Obesity-BMI 41.60 in February 2020  Plan: For Covid risk of complication score is 1 related to obesity.  She will stay at home and quarantine.  She is going to be at home for 10 to 14 days.  I am calling in Zithromax  Z-PAK 2 p.o. day 1 followed by 1 p.o. days 2 through 5, prednisone 10 mg starting with 60 mg decreasing by 10 mg daily over 6 days.  Hycodan 1 teaspoon p.o. every 8 hours as needed cough.  This may also help headache and myalgias.  We will schedule another virtual visit in 48 hours.  She will call me sooner if symptoms worsen.

## 2019-05-18 NOTE — Telephone Encounter (Signed)
Scheduled appointment

## 2019-05-18 NOTE — Telephone Encounter (Signed)
Kara Wilkinson [phone]  Kara Wilkinson called to say she was diagnosed with COVID on Sunday from [place]. She has deen running fever between 99.9 to 102., it is now 101, she has cough and her chest is starting to get tight.

## 2019-05-18 NOTE — Patient Instructions (Signed)
We are sorry to hear you have COVID-19 infection.  Please get pulse oximeter and thermometer and monitor temp and pulse oximetry.  Call if pulse oximetry decreases to less than 95% or you have significant shortness of breath.  Take Zithromax and prednisone as directed and Hycodan sparingly for cough.  Hycodan will also help myalgias and headache.  Stay well-hydrated.  We will plan to speak again in 48 hours.  Please call sooner if you have concerns or worsening symptoms.

## 2019-07-24 ENCOUNTER — Ambulatory Visit: Payer: 59 | Attending: Internal Medicine

## 2019-07-24 DIAGNOSIS — Z23 Encounter for immunization: Secondary | ICD-10-CM

## 2019-07-24 NOTE — Progress Notes (Signed)
   Covid-19 Vaccination Clinic  Name:  Kara Wilkinson    MRN: [id number] DOB: Feb 08, 1976  07/24/2019  Kara Wilkinson was observed post Covid-19 immunization for 15 minutes without incident. She was provided with Vaccine Information Sheet and instruction to access the V-Safe system.   Kara Wilkinson was instructed to call 911 with any severe reactions post vaccine: Marland Kitchen Difficulty breathing  . Swelling of face and throat  . A fast heartbeat  . A bad rash all over body  . Dizziness and weakness   Immunizations Administered    Name Date Dose VIS Date Route   Pfizer COVID-19 Vaccine 07/24/2019  8:15 AM 0.3 mL 03/26/2019 Intramuscular   Manufacturer: [place]   Lot: [id number]   [place]: [id number]

## 2019-08-16 ENCOUNTER — Ambulatory Visit: Payer: 59

## 2019-08-25 ENCOUNTER — Ambulatory Visit: Payer: 59 | Attending: Internal Medicine

## 2019-08-25 DIAGNOSIS — Z23 Encounter for immunization: Secondary | ICD-10-CM

## 2019-08-25 NOTE — Progress Notes (Signed)
   Covid-19 Vaccination Clinic  Name:  KAILIE KUIPERS    MRN: [id number] DOB: 11-Sep-1975  08/25/2019  Ms. Killilea was observed post Covid-19 immunization for 15 minutes without incident. She was provided with Vaccine Information Sheet and instruction to access the V-Safe system.   Ms. Lippe was instructed to call 911 with any severe reactions post vaccine: Marland Kitchen Difficulty breathing  . Swelling of face and throat  . A fast heartbeat  . A bad rash all over body  . Dizziness and weakness   Immunizations Administered    Name Date Dose VIS Date Route   Pfizer COVID-19 Vaccine 08/25/2019  8:18 AM 0.3 mL 06/09/2018 Intramuscular   Manufacturer: [place]   Lot: [id number]   [place]: [id number]

## 2019-11-30 IMAGING — DX DG HAND COMPLETE 3+V*R*
3 series · 3 of 3 positions shown · non-contrast
Comparison: None.

CLINICAL DATA: Fell down 7 stairs at church this morning, RIGHT
ankle and RIGHT hand pain, swelling to entire ankle, pain in index
finger radiating into first metacarpal region, initial encounter

EXAM:
RIGHT HAND - COMPLETE 3+ VIEW

[hand pa]
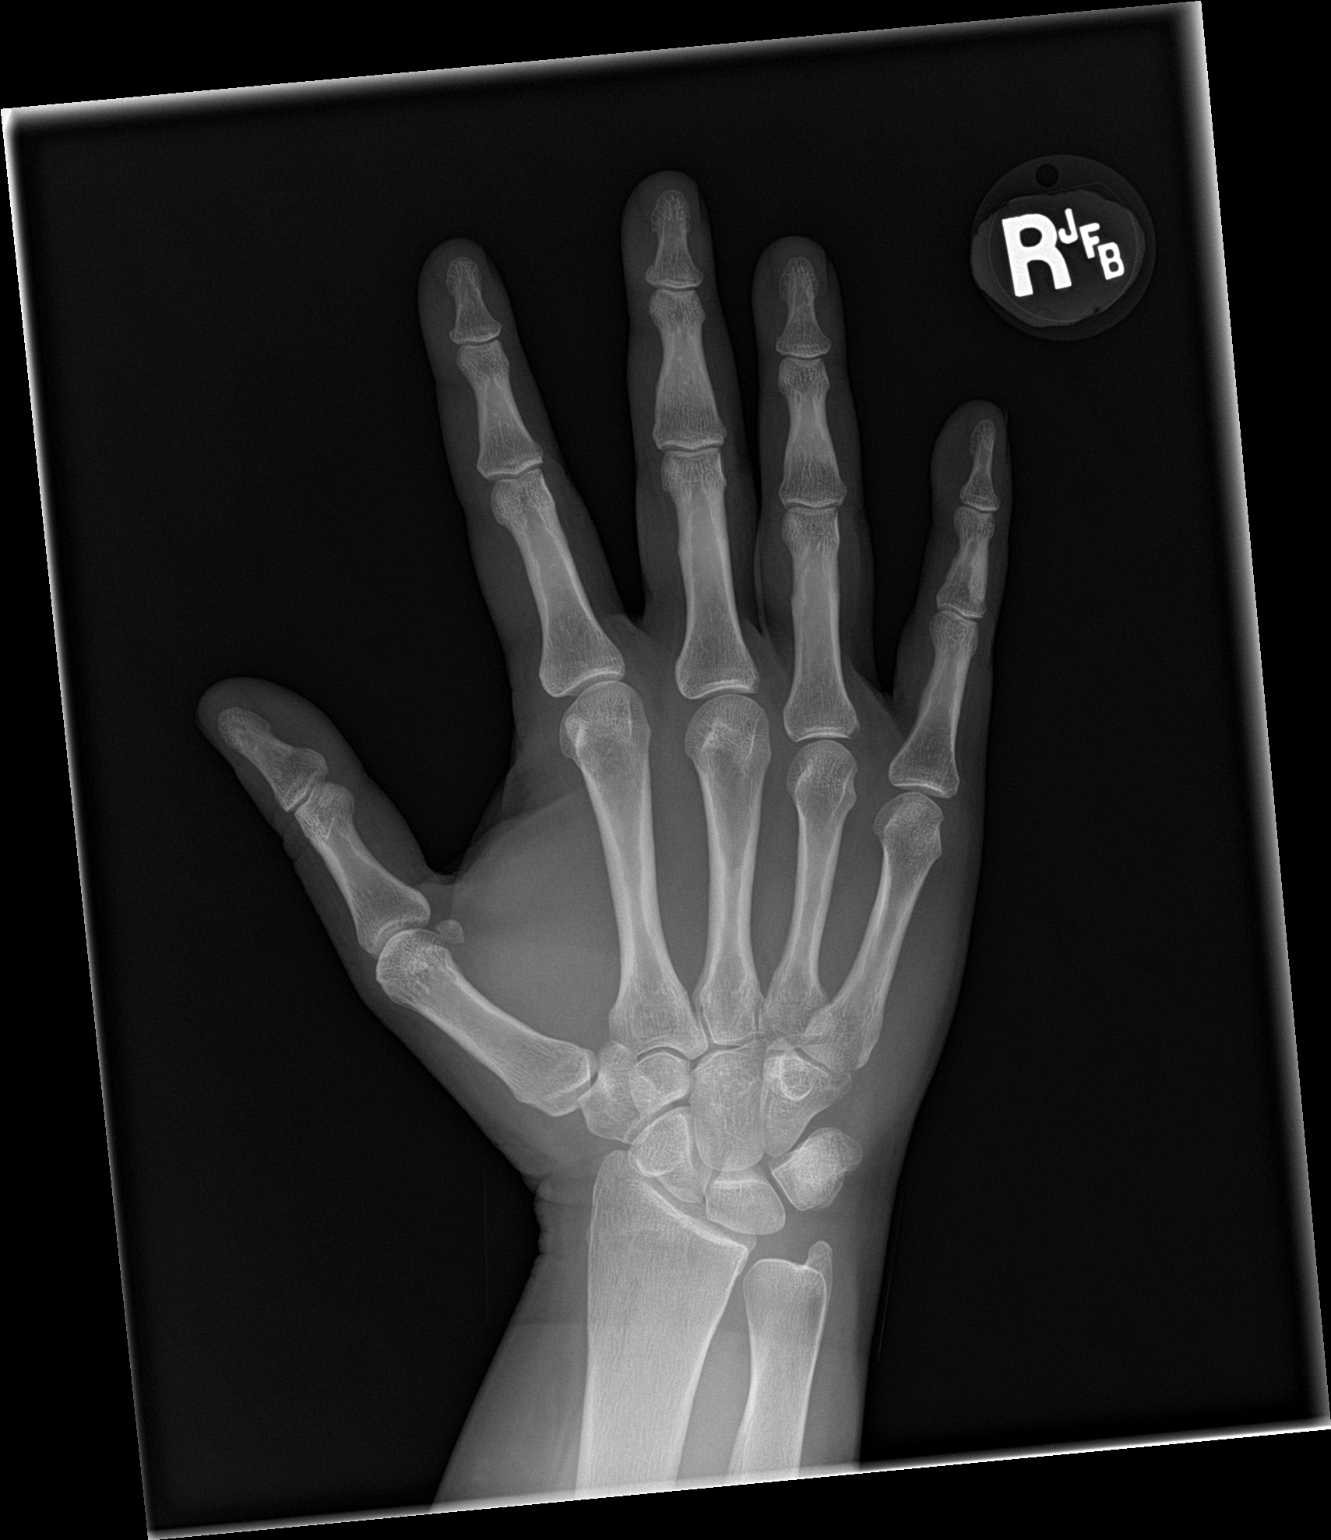

[hand obl]
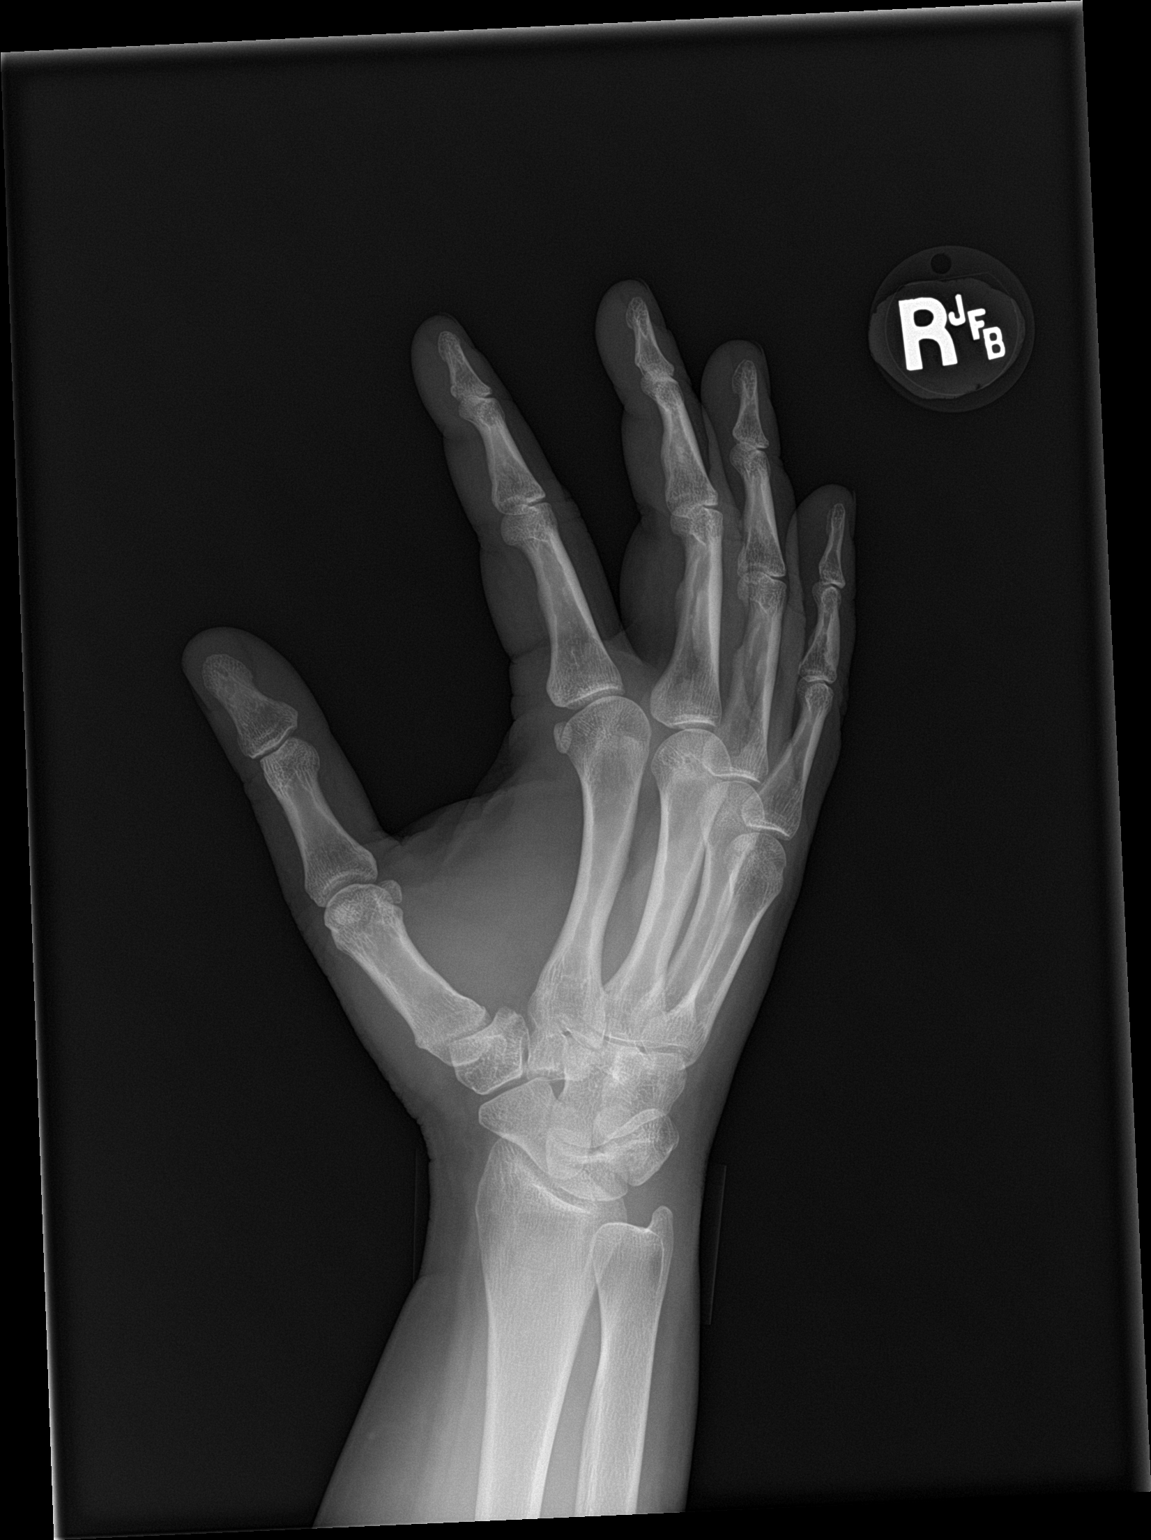

[hand lat]
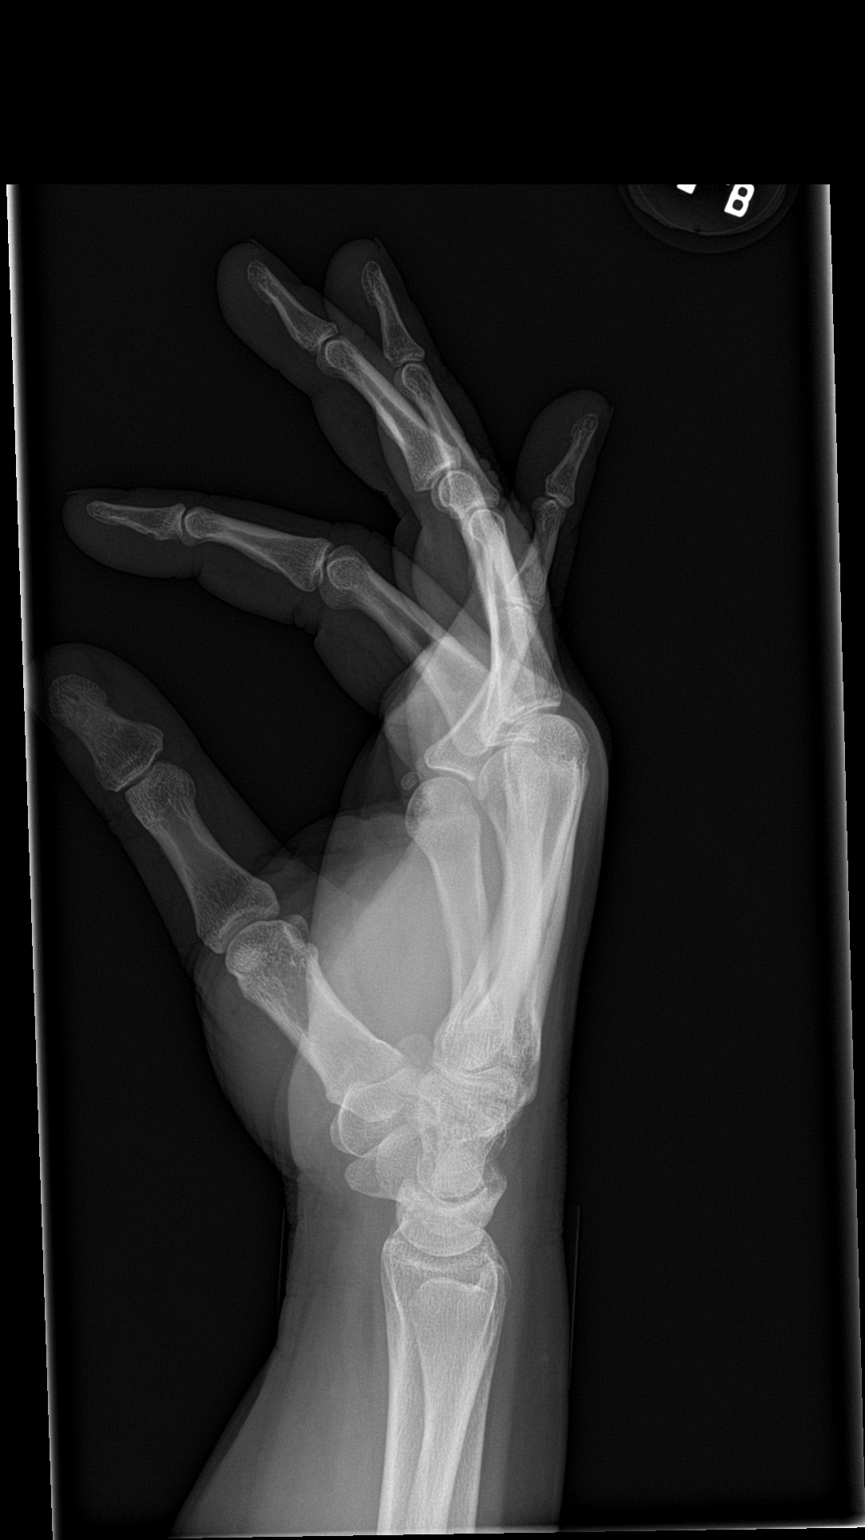

[3 of 3 positions shown; findings below may reference images not displayed]

FINDINGS: Osseous mineralization normal.

Joint spaces preserved.

No acute fracture, dislocation or bone destruction.
IMPRESSION: Normal exam.

## 2020-03-27 ENCOUNTER — Encounter: Payer: Self-pay | Admitting: Internal Medicine

## 2020-03-27 ENCOUNTER — Telehealth: Payer: Self-pay | Admitting: Internal Medicine

## 2020-03-27 ENCOUNTER — Ambulatory Visit (INDEPENDENT_AMBULATORY_CARE_PROVIDER_SITE_OTHER): Payer: 59 | Admitting: Internal Medicine

## 2020-03-27 ENCOUNTER — Other Ambulatory Visit: Payer: Self-pay

## 2020-03-27 VITALS — BP 142/84 | HR 83 | Temp 98.8°F | Ht 65.0 in | Wt 255.0 lb

## 2020-03-27 DIAGNOSIS — H9319 Tinnitus, unspecified ear: Secondary | ICD-10-CM

## 2020-03-27 DIAGNOSIS — H8110 Benign paroxysmal vertigo, unspecified ear: Secondary | ICD-10-CM | POA: Diagnosis not present

## 2020-03-27 DIAGNOSIS — Z8616 Personal history of COVID-19: Secondary | ICD-10-CM

## 2020-03-27 DIAGNOSIS — R030 Elevated blood-pressure reading, without diagnosis of hypertension: Secondary | ICD-10-CM | POA: Diagnosis not present

## 2020-03-27 DIAGNOSIS — H6503 Acute serous otitis media, bilateral: Secondary | ICD-10-CM

## 2020-03-27 MED ORDER — ALPRAZOLAM 0.25 MG PO TABS: ORAL_TABLET | ORAL | 0 refills | Status: DC

## 2020-03-27 MED ORDER — AZITHROMYCIN 250 MG PO TABS: ORAL_TABLET | ORAL | 0 refills | Status: DC

## 2020-03-27 NOTE — Telephone Encounter (Signed)
After talking with Dr Baxley scheduled appointment ?

## 2020-03-27 NOTE — Telephone Encounter (Signed)
Kara Wilkinson [phone]  Anderson Malta called to say she has double ear pain, some vertigo and would like to be seen.

## 2020-03-27 NOTE — Progress Notes (Signed)
   Subjective:    Patient ID: Kara Wilkinson, female    DOB: [birth year], 44 y.o.   MRN: [id number]  HPI  44 year old Female seen today with ear stuffiness and dizziness. Last seen here via virtual visit in February 2021 having been diagnosed with Covid-19 infection.  . Also seen with acute serous otitis media February 2020.At that time was complaining of tinnitus and ear fullness.  Was also having vertigo at that time.  Was treated with Xanax for vertigo and was given a Zithromax Z-PAK.  Was felt to have tinnitus triggered by bilateral serous otitis media.  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  She is married and works for [employer].  Does not smoke or consume alcohol.  Husband has undergone kidney and pancreas transplant with history of end-stage renal disease.  Family history: Patient's father with history of end-stage renal disease, diabetes, macular degeneration, stroke.  Mother in good health.  1 sister with history of lupus and narcolepsy.  Another sister with history of arthritis.  Had vasovagal syncope in 1998.  History of fractured left index finger 1990.  No history of serious illnesses accidents or operations.  History of vasovagal syncope April 1998.  Sees OB/GYN nurse practitioner.  Amoxicillin causes hives.  Nickel causes a rash.    Review of Systems had a fall in 2019 but did not have head trauma.  Patient complaining of bilateral ear pain and vertigo today. History of tinnitus    Objective:   Physical Exam Blood pressure 142/84 pulse 83 temperature 98.8 degrees pulse oximetry 97% weight 255 pounds.  BMI 42.43  Skin warm and dry.  Nodes none.  Nystagmus is noted.  PERRLA.  TMs are full but not red.  Neck is supple.  No adenopathy or thyromegaly.  Chest clear to auscultation. Cranial nerves II through XII are grossly intact.  Extraocular movements are full.  No facial weakness.  Moves all 4 extremities.  Muscle strength in extremities is  normal.  Cardiac exam: Regular rate and rhythm normal S1 and S2.  Head is normocephalic atraumatic.      Assessment & Plan:  Benign positional vertigo-?  Rule out Mnire's disease.  Apparently vertigo has been ongoing  Bilateral serous otitis media  Plan: Patient says that vertigo is not new and she would like to see ENT physician.  We have given her Xanax in the past for vertigo.  Have prescribed Xanax 0.25 mg twice daily as needed for vertigo and Zithromax Z-PAK 2 tabs day 1 followed by 1 tab days 2 through 5.  We will make ENT referral.

## 2020-04-12 ENCOUNTER — Encounter: Payer: Self-pay | Admitting: Internal Medicine

## 2020-04-12 NOTE — Patient Instructions (Signed)
Take Xanax 0.25 mg twice daily as needed for vertigo.  Take Zithromax Z-PAK 2 tabs day 1 followed by 1 tab days 2 through 5.  We will make ENT referral to rule out Mnire's disease.

## 2020-04-13 ENCOUNTER — Telehealth: Payer: Self-pay | Admitting: Internal Medicine

## 2020-04-13 NOTE — Telephone Encounter (Signed)
Called to see if patient had checked on Blood pressure she was in office and she has not. She will use husbands machine and start checking on it and will call if it is elevated.  I also gave her the phone number for ENT referral.

## 2020-05-18 ENCOUNTER — Other Ambulatory Visit: Payer: 59 | Admitting: Internal Medicine

## 2020-05-23 ENCOUNTER — Encounter: Payer: 59 | Admitting: Internal Medicine

## 2020-11-17 ENCOUNTER — Telehealth (INDEPENDENT_AMBULATORY_CARE_PROVIDER_SITE_OTHER): Payer: 59 | Admitting: Internal Medicine

## 2020-11-17 ENCOUNTER — Telehealth: Payer: Self-pay | Admitting: Internal Medicine

## 2020-11-17 ENCOUNTER — Other Ambulatory Visit: Payer: Self-pay

## 2020-11-17 ENCOUNTER — Encounter: Payer: Self-pay | Admitting: Internal Medicine

## 2020-11-17 VITALS — BP 132/70 | Temp 97.8°F

## 2020-11-17 DIAGNOSIS — U071 COVID-19: Secondary | ICD-10-CM | POA: Diagnosis not present

## 2020-11-17 DIAGNOSIS — H8109 Meniere's disease, unspecified ear: Secondary | ICD-10-CM | POA: Diagnosis not present

## 2020-11-17 DIAGNOSIS — J22 Unspecified acute lower respiratory infection: Secondary | ICD-10-CM | POA: Diagnosis not present

## 2020-11-17 DIAGNOSIS — Z8669 Personal history of other diseases of the nervous system and sense organs: Secondary | ICD-10-CM

## 2020-11-17 MED ORDER — BENZONATATE 100 MG PO CAPS: 100.0000 mg | ORAL_CAPSULE | Freq: Three times a day (TID) | ORAL | 0 refills | Status: DC | PRN

## 2020-11-17 MED ORDER — AZITHROMYCIN 250 MG PO TABS: ORAL_TABLET | ORAL | 0 refills | Status: DC

## 2020-11-17 NOTE — Telephone Encounter (Signed)
Kara Wilkinson [phone]  Anderson Malta tested positive for COVID on Monday, she had fever in the beginning it is better now, however she has lingering cough and runny eyes. Nothing is helping that. Okay to set up virtual visit for this afternoon?

## 2020-11-17 NOTE — Progress Notes (Signed)
   Subjective:    Patient ID: Kara Wilkinson, female    DOB: Aug 14, 1975, 45 y.o.   MRN: [id number]  HPI I connected with Kara Malta L. Wandel DOB Aug 25, 1975 on 11/17/2020 by a video enabled telemedicine application and verified that I am speaking with the correct person using two identifiers.    Patient is at her home and I am in my office.  She is agreeable to visit in this format today.  Patient called the office saying that she had tested positive for COVID-19 on Monday, August 1.  She had fever and subsequently developed a cough with coryza.  Temperature has improved but she still has a lingering cough.  Virtual visit was advised.  Patient had COVID-19 in February 2021.  Records indicate she has had 2 COVID-19 immunizations.  Patient indicates she has been diagnosed with Mnire's disease by Dr. Benjamine Wilkinson, ENT physician.  Also diagnosed in 2016 with excessive daytime somnolence at [facility].  Had sleep study confirming sleep apnea and CPAP was ordered in August 2016.  No history of operations.  She is married and works for [employer].  She is allergic to amoxicillin-it causes hives  History of vasovagal syncope April 1998.   Review of Systems see above     Objective:   Physical Exam  She is seen via interactive audio and video telecommunications in no acute distress.  She looks a bit pale and fatigued.      Assessment & Plan:  Acute COVID-19 virus infection  Acute lower respiratory infection  History of Mnire's disease-has seen Dr. Benjamine Wilkinson  History of sleep apnea-treated with CPAP  Plan: Have prescribed Tessalon Perles 100 mg 3 times daily as needed for cough.  Rest and drink fluids.  Stay well-hydrated.  Watch position change from lying or sitting to standing due to history of Mnire's disease which could be aggravated by this illness.  Patient does not want Paxlovid or antibiotics at this time.

## 2020-11-17 NOTE — Telephone Encounter (Signed)
scheduled

## 2020-11-24 ENCOUNTER — Encounter: Payer: Self-pay | Admitting: Internal Medicine

## 2020-11-24 NOTE — Patient Instructions (Signed)
Rest and drink fluids.  Stay well-hydrated.  Watch for position change when going from lying or sitting or standing due to history of vertigo/Mnire's disease.  Take Tessalon Perles up to 3 times daily as needed for cough.

## 2021-04-03 LAB — HM PAP SMEAR: HM Pap smear: NEGATIVE

## 2021-09-11 ENCOUNTER — Encounter: Payer: Self-pay | Admitting: Internal Medicine

## 2021-09-11 ENCOUNTER — Ambulatory Visit (INDEPENDENT_AMBULATORY_CARE_PROVIDER_SITE_OTHER): Payer: 59 | Admitting: Internal Medicine

## 2021-09-11 ENCOUNTER — Telehealth: Payer: Self-pay | Admitting: Internal Medicine

## 2021-09-11 VITALS — BP 126/78 | HR 66 | Temp 98.9°F

## 2021-09-11 DIAGNOSIS — H6501 Acute serous otitis media, right ear: Secondary | ICD-10-CM | POA: Diagnosis not present

## 2021-09-11 DIAGNOSIS — H8109 Meniere's disease, unspecified ear: Secondary | ICD-10-CM

## 2021-09-11 DIAGNOSIS — J029 Acute pharyngitis, unspecified: Secondary | ICD-10-CM | POA: Diagnosis not present

## 2021-09-11 MED ORDER — AZITHROMYCIN 250 MG PO TABS: ORAL_TABLET | ORAL | 0 refills | Status: AC

## 2021-09-11 MED ORDER — METHYLPREDNISOLONE ACETATE 80 MG/ML IJ SUSP
80.0000 mg | Freq: Once | INTRAMUSCULAR | Status: AC
  Administered 2021-09-11: 80 mg via INTRAMUSCULAR

## 2021-09-11 NOTE — Progress Notes (Signed)
   Subjective:    Patient ID: Kara Wilkinson, female    DOB: 1975/10/26, 46 y.o.   MRN: [id number]  HPI 46 year old Female with hx of Meniere's disease seen for complaint of  intermittent episodes of sore throat  for a few months and  recently right ear congestion. Hx of Meniere's disease seen by Dr. Benjamine Mola.  No recent attacks of vertigo.  If she has vertigo, she takes Xanax sparingly which helps.  Has never been Allergy tested.  No fever or chills.  No significant cough.  Issues with tinnitus and ear fullness started in February 2020.  Was having vertigo at the time.  Was treated with Xanax for vertigo and given a Zithromax Z-PAK.  At that time it was felt that tinnitus was triggered by bilateral serous otitis media.  Had COVID-19 in August 2022 in February 2021.  She does not smoke or consume alcohol.  No history of hospitalizations.  History of vasovagal syncope April 1998.  She is allergic to Amoxicillin-it causes hives.  Received GYN care at [facility].  Social history: She is married and works from home for [employer].  Mother recently had stroke.  She is taking mother into her home and will have some in-home help.        Review of Systems see above denies fever, chills, anorexia     Objective:   Physical Exam Her blood pressure is normal.  She is afebrile.  Pulse oximetry 98%. Right TM is slightly full with light reflex present.  Left TM is clear.  Pharynx is slightly injected.  Rapid strep screen is negative.  Neck is supple without thyromegaly or adenopathy.  Chest is clear to auscultation.      Assessment & Plan:  History of Mnire's disease seen by Dr. Benjamine Mola, ENT physician, and takes Xanax sparingly when she has episodes of vertigo.  No recent episodes.  Acute right serous otitis media  She possibly could have allergic rhinitis.  I have recommended allergy testing.  In the interim today she was given Depo-Medrol 80 mg IM and a Zithromax Z-PAK.  Is to  take 2 tablets day 1 followed by 1 tablet days 2 through 5.  If your symptoms persist I will ask that she see Dr. Benjamine Mola.  Making referral to Allergy and Asthma center.

## 2021-09-11 NOTE — Telephone Encounter (Signed)
LVM to CB and schedule appointments for CPE and sore throat off and on for the last 6 months, most be 2 separate appointments insurance will not pay to be together.

## 2021-09-11 NOTE — Telephone Encounter (Signed)
Scheduled an appointment for ears only this affternoon

## 2021-09-11 NOTE — Patient Instructions (Signed)
Depo-Medrol 80 mg IM.  Zithromax Z-PAK 2 tabs day 1 followed by 1 tab days 2 through 5.  We are making referral to Allergy and asthma center.  If ear issues do not improve, please see Dr. Benjamine Mola. you tested negative for strep throat today

## 2021-09-12 ENCOUNTER — Telehealth: Payer: Self-pay | Admitting: Internal Medicine

## 2021-09-12 NOTE — Telephone Encounter (Signed)
Faxed office notes to Dr Leta Baptist per Dr Lauree Chandler request, his information was not in [place]. [phone], phone [phone]

## 2021-09-12 NOTE — Telephone Encounter (Signed)
This message was sent via [place], a product from [employer] [email or web address]/                    -------Fax Transmission Report-------  To:               Recipient at [phone] Subject:          FW: Hp Scans Result:           The transmission was successful. Explanation:      All Pages Ok Pages Sent:       7 Connect Time:     3 minutes, 12 seconds Transmit Time:    09/12/2021 11:03 Transfer Rate:    14400 Status Code:      0000 Retry Count:      0 Job Id:           [id number] Unique Id:        [id number] Fax Line:         53 Fax Server:       [employer]

## 2021-11-12 ENCOUNTER — Encounter: Payer: Self-pay | Admitting: Adult Health

## 2021-11-12 ENCOUNTER — Ambulatory Visit (INDEPENDENT_AMBULATORY_CARE_PROVIDER_SITE_OTHER): Payer: 59 | Admitting: Adult Health

## 2021-11-12 VITALS — BP 128/86 | HR 77 | Ht 65.0 in | Wt 281.6 lb

## 2021-11-12 DIAGNOSIS — Z6841 Body Mass Index (BMI) 40.0 and over, adult: Secondary | ICD-10-CM | POA: Diagnosis not present

## 2021-11-12 DIAGNOSIS — G4710 Hypersomnia, unspecified: Secondary | ICD-10-CM | POA: Diagnosis not present

## 2021-11-12 DIAGNOSIS — G4733 Obstructive sleep apnea (adult) (pediatric): Secondary | ICD-10-CM | POA: Diagnosis not present

## 2021-11-12 NOTE — Patient Instructions (Signed)
Keep up good work  Sales executive for new CPAP  Wear CPAP At bedtime   Do not drive if sleepy  Work on healthy weight loss  Refer to [employer] and wellness .  Follow up in 3 months and As needed

## 2021-11-12 NOTE — Progress Notes (Signed)
$'@Patient'k$  ID: Kara Wilkinson, female    DOB: Aug 16, 1975, 46 y.o.   MRN: [id number]  Chief Complaint  Patient presents with   Follow-up    Referring provider: Elby Showers, MD  HPI: 46 year old female presents for sleep consult November 12, 2021 to reestablish for sleep apnea  TEST/EVENTS :  Split-night sleep study November 20, 2014 showed severe obstructive sleep apnea with AHI at 108.7/hour with an optimal CPAP pressure at 10 cm H2O.  11/12/21 Sleep consult  Patient presents for sleep consult today to reestablish for sleep apnea.  Patient was seen in May 2016 for sleep consult.  She was set up for a split-night sleep study that was completed on November 20, 2014 that showed very severe sleep apnea with AHI at 108.7/hour with an optimal CPAP pressure at 10 cm H2O.  Patient says she was started on CPAP after this and has been doing very well.  She says she wears her CPAP every single night this seems to help her quite a bit with decreased daytime sleepiness and improve sleep hygiene.  Patient says she benefits from her CPAP. CPAP download shows excellent compliance with 100% usage.  Daily average usage at 7.5 hours.  Patient is on CPAP at 10 cm H2O.  AHI is 1.5/hour. Denies any symptoms suspicious for cataplexy or sleep paralysis.  Patient does not use any sleep aids.  No has no caffeine.  Does not use any pain medicines.  Uses a fullface mask and uses DME with [employer].  She typically goes to bed about 10 PM.  Takes only a couple minutes to go to sleep.  Does not get up throughout the night.  Sleeps very soundly and gets up about 7 AM.  She says her weight has gone up about 40 pounds.  She is currently at 281 pounds with a BMI of 46.  Patient says no matter what she does she just seems to not be able to lose weight.  We discussed helpful hints.  Also discussed a healthy weight and wellness referral.  She says that she needs a new CPAP machine her machine is getting old and she is worried that it is  starting to wear out.  History reviewed. No pertinent surgical history.  Medical history significant for sleep apnea and obesity  Social history patient is married lives with her spouse.  She is a Librarian, academic at [employer].  She has no children. She is a never smoker.  No alcohol or drug use. Family history positive for allergies, asthma, heart disease and cancer.    Allergies  Allergen Reactions   Amoxicillin Hives    Immunization History  Administered Date(s) Administered   Influenza,inj,Quad PF,6+ Mos 03/19/2019   Influenza-Unspecified 03/15/2014   PFIZER(Purple Top)SARS-COV-2 Vaccination 07/24/2019, 08/25/2019   Tdap 05/19/2014    No past medical history on file.  Tobacco History: Social History   Tobacco Use  Smoking Status Never  Smokeless Tobacco Never   Counseling given: Not Answered   Outpatient Medications Prior to Visit  Medication Sig Dispense Refill   ALPRAZolam (XANAX) 0.25 MG tablet One by mouth twice daily as needed for vertigo 30 tablet 0   levonorgestrel (MIRENA) 20 MCG/24HR IUD 1 each by Intrauterine route once.     No facility-administered medications prior to visit.     Review of Systems:   Constitutional:   No  weight loss, night sweats,  Fevers, chills, fatigue, or  lassitude.  HEENT:   No headaches,  Difficulty swallowing,  Tooth/dental problems, or  Sore throat,                No sneezing, itching, ear ache, nasal congestion, post nasal drip,   CV:  No chest pain,  Orthopnea, PND, swelling in lower extremities, anasarca, dizziness, palpitations, syncope.   GI  No heartburn, indigestion, abdominal pain, nausea, vomiting, diarrhea, change in bowel habits, loss of appetite, bloody stools.   Resp: No shortness of breath with exertion or at rest.  No excess mucus, no productive cough,  No non-productive cough,  No coughing up of blood.  No change in color of mucus.  No wheezing.  No chest wall deformity  Skin: no rash or lesions.  GU: no  dysuria, change in color of urine, no urgency or frequency.  No flank pain, no hematuria   MS:  No joint pain or swelling.  No decreased range of motion.  No back pain.    Physical Exam  BP 128/86 (BP Location: Left Arm, Cuff Size: Normal)   Pulse 77   Ht '5\' 5"'$  (1.651 m)   Wt 281 lb 9.6 oz (127.7 kg)   SpO2 97%   BMI 46.86 kg/m   GEN: A/Ox3; pleasant , NAD, well nourished    HEENT:  [place]/AT,  EACs-clear, TMs-wnl, NOSE-clear, THROAT-clear, no lesions, no postnasal drip or exudate noted.   NECK:  Supple w/ fair ROM; no JVD; normal carotid impulses w/o bruits; no thyromegaly or nodules palpated; no lymphadenopathy.    RESP  Clear  P & A; w/o, wheezes/ rales/ or rhonchi. no accessory muscle use, no dullness to percussion  CARD:  RRR, no m/r/g, no peripheral edema, pulses intact, no cyanosis or clubbing.  GI:   Soft & nt; nml bowel sounds; no organomegaly or masses detected.   Musco: Warm bil, no deformities or joint swelling noted.   Neuro: alert, no focal deficits noted.    Skin: Warm, no lesions or rashes    Lab Results:  CBC    Component Value Date/Time   WBC 7.6 05/17/2014 0915   RBC 4.37 05/17/2014 0915   HGB 13.1 05/17/2014 0915   HCT 40.6 05/17/2014 0915   PLT 306 05/17/2014 0915   MCV 92.9 05/17/2014 0915   MCH 30.0 05/17/2014 0915   MCHC 32.3 05/17/2014 0915   RDW 13.5 05/17/2014 0915   LYMPHSABS 2.4 05/17/2014 0915   MONOABS 0.5 05/17/2014 0915   EOSABS 0.1 05/17/2014 0915   BASOSABS 0.0 05/17/2014 0915    BMET    Component Value Date/Time   NA 137 05/17/2014 0915   K 4.2 05/17/2014 0915   CL 102 05/17/2014 0915   CO2 29 05/17/2014 0915   GLUCOSE 79 05/17/2014 0915   BUN 12 05/17/2014 0915   CREATININE 0.71 05/17/2014 0915   CALCIUM 9.2 05/17/2014 0915    BNP No results found for: "BNP"  ProBNP No results found for: "PROBNP"  Imaging: No results found.        No data to display          No results found for:  "NITRICOXIDE"      Assessment & Plan:   No problem-specific Assessment & Plan notes found for this encounter.     Rexene Edison, NP 11/12/2021

## 2021-11-16 DIAGNOSIS — Z6841 Body Mass Index (BMI) 40.0 and over, adult: Secondary | ICD-10-CM | POA: Insufficient documentation

## 2021-11-16 DIAGNOSIS — G4733 Obstructive sleep apnea (adult) (pediatric): Secondary | ICD-10-CM | POA: Insufficient documentation

## 2021-11-16 NOTE — Progress Notes (Signed)
Reviewed and agree with assessment/plan.   Chesley Mires, MD [facility] 11/16/2021, 7:31 PM Pager:  [phone]

## 2021-11-16 NOTE — Assessment & Plan Note (Signed)
Very severe sleep apnea with excellent control compliance on CPAP.  Patient is to continue on her current settings.  We will order a new CPAP with 10 cm H2O.  CPAP care was discussed.   Patient Instructions  Keep up good work  Order for new CPAP  Wear CPAP At bedtime   Do not drive if sleepy  Work on healthy weight loss  Refer to [employer] [facility] and wellness .  Follow up in 3 months and As needed

## 2021-11-16 NOTE — Assessment & Plan Note (Signed)
Helpful hints for healthy weight loss.  Will refer to healthy weight and wellness

## 2021-11-29 ENCOUNTER — Telehealth: Payer: Self-pay | Admitting: Adult Health

## 2021-11-29 NOTE — Telephone Encounter (Signed)
Spoke with [employer] r/t C-Pap order placed on 11/12/21. [employer] stated paper order was faxed to office and need to be signed and faxed back to [place].   Routing to [place] to f/u with.

## 2021-12-03 NOTE — Telephone Encounter (Signed)
Barnet Pall, can you check Tammy's folder and see if there is a paper order to be signed for this patient?  Thanks.

## 2021-12-04 NOTE — Telephone Encounter (Signed)
Kara Wilkinson from [facility] checking on order faxed for CPAP machine. Kara Wilkinson phone number is [phone].

## 2021-12-05 NOTE — Telephone Encounter (Signed)
I have sent the fax to [place] as of this morning and I have received confirmation.

## 2022-01-31 ENCOUNTER — Other Ambulatory Visit: Payer: 59

## 2022-01-31 DIAGNOSIS — R030 Elevated blood-pressure reading, without diagnosis of hypertension: Secondary | ICD-10-CM

## 2022-01-31 DIAGNOSIS — G4710 Hypersomnia, unspecified: Secondary | ICD-10-CM

## 2022-01-31 DIAGNOSIS — G4733 Obstructive sleep apnea (adult) (pediatric): Secondary | ICD-10-CM

## 2022-01-31 DIAGNOSIS — Z13220 Encounter for screening for lipoid disorders: Secondary | ICD-10-CM

## 2022-01-31 DIAGNOSIS — Z136 Encounter for screening for cardiovascular disorders: Secondary | ICD-10-CM

## 2022-01-31 DIAGNOSIS — R5383 Other fatigue: Secondary | ICD-10-CM

## 2022-02-01 LAB — TSH: TSH: 2.63 mIU/L

## 2022-02-01 LAB — LIPID PANEL
Cholesterol: 236 mg/dL — ABNORMAL HIGH (ref <200)
HDL: 39 mg/dL — ABNORMAL LOW (ref >=50)
LDL Cholesterol (Calc): 170 mg/dL (calc) — ABNORMAL HIGH
Non-HDL Cholesterol (Calc): 197 mg/dL (calc) — ABNORMAL HIGH (ref <130)
Total CHOL/HDL Ratio: 6.1 (calc) — ABNORMAL HIGH (ref <5.0)
Triglycerides: 135 mg/dL (ref <150)

## 2022-02-01 LAB — COMPLETE METABOLIC PANEL WITH GFR
AG Ratio: 1.2 (calc) (ref 1.0–2.5)
ALT: 22 U/L (ref 6–29)
AST: 19 U/L (ref 10–35)
Albumin: 4.2 g/dL (ref 3.6–5.1)
Alkaline phosphatase (APISO): 89 U/L (ref 31–125)
BUN: 9 mg/dL (ref 7–25)
CO2: 27 mmol/L (ref 20–32)
Calcium: 9.1 mg/dL (ref 8.6–10.2)
Chloride: 103 mmol/L (ref 98–110)
Creat: 0.77 mg/dL (ref 0.50–0.99)
Globulin: 3.5 g/dL (calc) (ref 1.9–3.7)
Glucose, Bld: 96 mg/dL (ref 65–99)
Potassium: 4.5 mmol/L (ref 3.5–5.3)
Sodium: 140 mmol/L (ref 135–146)
Total Bilirubin: 1.1 mg/dL (ref 0.2–1.2)
Total Protein: 7.7 g/dL (ref 6.1–8.1)
eGFR: 95 mL/min/{1.73_m2} (ref >=60)

## 2022-02-01 LAB — CBC WITH DIFFERENTIAL/PLATELET
Absolute Monocytes: 403 cells/uL (ref 200–950)
Basophils Absolute: 50 cells/uL (ref 0–200)
Basophils Relative: 0.7 %
Eosinophils Absolute: 72 cells/uL (ref 15–500)
Eosinophils Relative: 1 %
HCT: 41.9 % (ref 35.0–45.0)
Hemoglobin: 13.4 g/dL (ref 11.7–15.5)
Lymphs Abs: 1944 cells/uL (ref 850–3900)
MCH: 30.4 pg (ref 27.0–33.0)
MCHC: 32 g/dL (ref 32.0–36.0)
MCV: 95 fL (ref 80.0–100.0)
MPV: 10.1 fL (ref 7.5–12.5)
Monocytes Relative: 5.6 %
Neutro Abs: 4730 cells/uL (ref 1500–7800)
Neutrophils Relative %: 65.7 %
Platelets: 345 10*3/uL (ref 140–400)
RBC: 4.41 10*6/uL (ref 3.80–5.10)
RDW: 12.3 % (ref 11.0–15.0)
Total Lymphocyte: 27 %
WBC: 7.2 10*3/uL (ref 3.8–10.8)

## 2022-02-04 ENCOUNTER — Encounter: Payer: Self-pay | Admitting: Internal Medicine

## 2022-02-04 ENCOUNTER — Ambulatory Visit (INDEPENDENT_AMBULATORY_CARE_PROVIDER_SITE_OTHER): Payer: 59 | Admitting: Internal Medicine

## 2022-02-04 VITALS — BP 130/76 | HR 70 | Temp 98.6°F | Ht 65.0 in | Wt 285.4 lb

## 2022-02-04 DIAGNOSIS — H8109 Meniere's disease, unspecified ear: Secondary | ICD-10-CM

## 2022-02-04 DIAGNOSIS — Z23 Encounter for immunization: Secondary | ICD-10-CM

## 2022-02-04 DIAGNOSIS — Z1211 Encounter for screening for malignant neoplasm of colon: Secondary | ICD-10-CM | POA: Diagnosis not present

## 2022-02-04 DIAGNOSIS — Z6841 Body Mass Index (BMI) 40.0 and over, adult: Secondary | ICD-10-CM

## 2022-02-04 DIAGNOSIS — Z8249 Family history of ischemic heart disease and other diseases of the circulatory system: Secondary | ICD-10-CM | POA: Diagnosis not present

## 2022-02-04 DIAGNOSIS — Z8669 Personal history of other diseases of the nervous system and sense organs: Secondary | ICD-10-CM

## 2022-02-04 DIAGNOSIS — Z0000 Encounter for general adult medical examination without abnormal findings: Secondary | ICD-10-CM

## 2022-02-04 DIAGNOSIS — E7800 Pure hypercholesterolemia, unspecified: Secondary | ICD-10-CM

## 2022-02-04 LAB — POCT URINALYSIS DIPSTICK
Bilirubin, UA: NEGATIVE
Blood, UA: POSITIVE
Glucose, UA: NEGATIVE
Ketones, UA: NEGATIVE
Leukocytes, UA: NEGATIVE
Nitrite, UA: NEGATIVE
Protein, UA: NEGATIVE
Spec Grav, UA: 1.01 (ref 1.010–1.025)
Urobilinogen, UA: 0.2 E.U./dL
pH, UA: 8 (ref 5.0–8.0)

## 2022-02-04 MED ORDER — ROSUVASTATIN CALCIUM 5 MG PO TABS: 5.0000 mg | ORAL_TABLET | Freq: Every day | ORAL | 3 refills | Status: DC

## 2022-02-04 NOTE — Progress Notes (Unsigned)
   Subjective:    Patient ID: Kara Wilkinson, female    DOB: [birth year], 46 y.o.   MRN: [id number]  HPI 46 year old Female    Review of Systems     Objective:   Physical Exam        Assessment & Plan:

## 2022-02-05 ENCOUNTER — Other Ambulatory Visit: Payer: Self-pay | Admitting: Internal Medicine

## 2022-02-05 MED ORDER — ALPRAZOLAM 0.25 MG PO TABS: ORAL_TABLET | ORAL | 1 refills | Status: DC

## 2022-02-12 ENCOUNTER — Ambulatory Visit: Payer: 59 | Admitting: Adult Health

## 2022-02-12 NOTE — Patient Instructions (Addendum)
May want to consider coronary calcium scoring with family history of heart disease.  It was a pleasure to see you today.  Flu vaccine given.  You would be a candidate for [facility] healthy weight clinic and/or perhaps consideration of gastric bypass surgery if you so desire.  Return in 1 year or as needed.  Take Crestor 5 mg daily for hyperlipidemia.  Screening colonoscopy is due.  Flu vaccine given.  Follow-up with [facility] with Dr. Benjamine Mola

## 2022-02-25 ENCOUNTER — Encounter: Payer: Self-pay | Admitting: Gastroenterology

## 2022-02-28 ENCOUNTER — Telehealth: Payer: Self-pay | Admitting: *Deleted

## 2022-02-28 ENCOUNTER — Encounter: Payer: Self-pay | Admitting: Adult Health

## 2022-02-28 ENCOUNTER — Ambulatory Visit (INDEPENDENT_AMBULATORY_CARE_PROVIDER_SITE_OTHER): Payer: 59 | Admitting: Adult Health

## 2022-02-28 VITALS — BP 108/80 | HR 74 | Temp 98.2°F | Ht 65.0 in | Wt 285.8 lb

## 2022-02-28 DIAGNOSIS — G4733 Obstructive sleep apnea (adult) (pediatric): Secondary | ICD-10-CM

## 2022-02-28 NOTE — Telephone Encounter (Signed)
Called and spoke with Melissa with [employer].  Requested that [facility] be tagged in the patient's airview account.  She stated she would do so.

## 2022-02-28 NOTE — Patient Instructions (Addendum)
Keep up good work  Wear CPAP At bedtime   Do not drive if sleepy  Work on healthy weight loss  Refer to [employer] and wellness .  Follow up in 1 year and As needed

## 2022-02-28 NOTE — Progress Notes (Signed)
$'@Patient'P$  ID: Kara Wilkinson, female    DOB: 08/02/1975, 46 y.o.   MRN: [id number]  Chief Complaint  Patient presents with   Follow-up    Referring provider: Elby Showers, MD  HPI: 46 year old female seen for sleep consult November 12, 2021 to reestablish for sleep apnea  TEST/EVENTS :  Split-night sleep study November 20, 2014 showed severe obstructive sleep apnea with AHI at 108.7/hour with an optimal CPAP pressure at 10 cm H2O.   02/28/2022 Follow up ; OSA Patient presents for the 5-monthfollow-up.  Patient has underlying severe sleep apnea.  She is on CPAP at bedtime.  She recently got a new CPAP machine in August 2023.  Says it is working well.  She feels that she benefits from CPAP with decreased daytime sleepiness.  She says she wears her CPAP every single night.  She is using a fullface mask.  CPAP download shows 100% compliance.  Daily average usage at 8 hours.  Patient is on CPAP 10 cm H2O.  AHI is 1.3/hour.  Patient is currently on CPAP 10 cm of H2O. We discussed healthy weight loss.  Patient will be referred to the healthy weight and wellness.  Allergies  Allergen Reactions   Amoxicillin Hives    Immunization History  Administered Date(s) Administered   Influenza,inj,Quad PF,6+ Mos 03/19/2019, 02/04/2022   Influenza-Unspecified 03/15/2014   PFIZER(Purple Top)SARS-COV-2 Vaccination 07/24/2019, 08/25/2019   Tdap 05/19/2014    No past medical history on file.  Tobacco History: Social History   Tobacco Use  Smoking Status Never  Smokeless Tobacco Never   Counseling given: Not Answered   Outpatient Medications Prior to Visit  Medication Sig Dispense Refill   ALPRAZolam (XANAX) 0.25 MG tablet One by mouth twice daily as needed for vertigo (Patient taking differently: 2 (two) times daily as needed. One by mouth twice daily as needed for vertigo) 60 tablet 1   levonorgestrel (MIRENA) 20 MCG/24HR IUD 1 each by Intrauterine route once.     rosuvastatin (CRESTOR) 5 MG  tablet Take 1 tablet (5 mg total) by mouth daily. 90 tablet 3   No facility-administered medications prior to visit.     Review of Systems:   Constitutional:   No  weight loss, night sweats,  Fevers, chills, fatigue, or  lassitude.  HEENT:   No headaches,  Difficulty swallowing,  Tooth/dental problems, or  Sore throat,                No sneezing, itching, ear ache, nasal congestion, post nasal drip,   CV:  No chest pain,  Orthopnea, PND, swelling in lower extremities, anasarca, dizziness, palpitations, syncope.   GI  No heartburn, indigestion, abdominal pain, nausea, vomiting, diarrhea, change in bowel habits, loss of appetite, bloody stools.   Resp: No shortness of breath with exertion or at rest.  No excess mucus, no productive cough,  No non-productive cough,  No coughing up of blood.  No change in color of mucus.  No wheezing.  No chest wall deformity  Skin: no rash or lesions.  GU: no dysuria, change in color of urine, no urgency or frequency.  No flank pain, no hematuria   MS:  No joint pain or swelling.  No decreased range of motion.  No back pain.    Physical Exam  BP 108/80 (BP Location: Left Arm, Patient Position: Sitting, Cuff Size: Large)   Pulse 74   Temp 98.2 F (36.8 C) (Oral)   Ht '5\' 5"'$  (1.651 m)  Wt 285 lb 12.8 oz (129.6 kg)   SpO2 98%   BMI 47.56 kg/m   GEN: A/Ox3; pleasant , NAD, well nourished    HEENT:  /AT, NOSE-clear, THROAT-clear, no lesions, no postnasal drip or exudate noted.   NECK:  Supple w/ fair ROM; no JVD; normal carotid impulses w/o bruits; no thyromegaly or nodules palpated; no lymphadenopathy.    RESP  Clear  P & A; w/o, wheezes/ rales/ or rhonchi. no accessory muscle use, no dullness to percussion  CARD:  RRR, no m/r/g, no peripheral edema, pulses intact, no cyanosis or clubbing.  GI:   Soft & nt; nml bowel sounds; no organomegaly or masses detected.   Musco: Warm bil, no deformities or joint swelling noted.   Neuro: alert,  no focal deficits noted.    Skin: Warm, no lesions or rashes    Lab Results:  CBC    Component Value Date/Time   WBC 7.2 01/31/2022 0939   RBC 4.41 01/31/2022 0939   HGB 13.4 01/31/2022 0939   HCT 41.9 01/31/2022 0939   PLT 345 01/31/2022 0939   MCV 95.0 01/31/2022 0939   MCH 30.4 01/31/2022 0939   MCHC 32.0 01/31/2022 0939   RDW 12.3 01/31/2022 0939   LYMPHSABS 1,944 01/31/2022 0939   MONOABS 0.5 05/17/2014 0915   EOSABS 72 01/31/2022 0939   BASOSABS 50 01/31/2022 0939    BMET    Component Value Date/Time   NA 140 01/31/2022 0939   K 4.5 01/31/2022 0939   CL 103 01/31/2022 0939   CO2 27 01/31/2022 0939   GLUCOSE 96 01/31/2022 0939   BUN 9 01/31/2022 0939   CREATININE 0.77 01/31/2022 0939   CALCIUM 9.1 01/31/2022 0939    BNP No results found for: "BNP"  ProBNP No results found for: "PROBNP"  Imaging: No results found.        No data to display          No results found for: "NITRICOXIDE"      Assessment & Plan:   No problem-specific Assessment & Plan notes found for this encounter.     Rexene Edison, NP 02/28/2022

## 2022-03-01 NOTE — Progress Notes (Signed)
Reviewed and agree with assessment/plan.   Chesley Mires, MD [facility] Pulmonary/Critical Care 03/01/2022, 4:33 PM Pager:  [phone]

## 2022-03-01 NOTE — Assessment & Plan Note (Signed)
Excellent control and compliance on CPAP.  Continues current settings  Plan  Patient Instructions  Keep up good work  Wear CPAP At bedtime   Do not drive if sleepy  Work on healthy weight loss  Refer to [employer] and wellness .  Follow up in 1 year and As needed

## 2022-03-22 ENCOUNTER — Ambulatory Visit (HOSPITAL_BASED_OUTPATIENT_CLINIC_OR_DEPARTMENT_OTHER)
Admission: RE | Admit: 2022-03-22 | Discharge: 2022-03-22 | Disposition: A | Discharge status: Home / Self Care | Payer: 59 | Source: Ambulatory Visit | Attending: Internal Medicine | Admitting: Internal Medicine

## 2022-03-22 DIAGNOSIS — Z8249 Family history of ischemic heart disease and other diseases of the circulatory system: Secondary | ICD-10-CM | POA: Insufficient documentation

## 2022-03-25 ENCOUNTER — Ambulatory Visit (AMBULATORY_SURGERY_CENTER): Payer: 59 | Admitting: *Deleted

## 2022-03-25 VITALS — Ht 65.0 in | Wt 288.0 lb

## 2022-03-25 DIAGNOSIS — Z1211 Encounter for screening for malignant neoplasm of colon: Secondary | ICD-10-CM

## 2022-03-25 MED ORDER — NA SULFATE-K SULFATE-MG SULF 17.5-3.13-1.6 GM/177ML PO SOLN: 1.0000 | Freq: Once | ORAL | 0 refills | Status: AC

## 2022-03-25 NOTE — Progress Notes (Signed)

## 2022-04-18 ENCOUNTER — Encounter: Payer: Self-pay | Admitting: Gastroenterology

## 2022-04-22 ENCOUNTER — Encounter: Payer: Self-pay | Admitting: Gastroenterology

## 2022-04-22 ENCOUNTER — Ambulatory Visit (AMBULATORY_SURGERY_CENTER): Payer: 59 | Admitting: Gastroenterology

## 2022-04-22 VITALS — BP 107/68 | HR 64 | Temp 98.6°F | Resp 16 | Ht 65.0 in | Wt 288.0 lb

## 2022-04-22 DIAGNOSIS — D123 Benign neoplasm of transverse colon: Secondary | ICD-10-CM

## 2022-04-22 DIAGNOSIS — Z1211 Encounter for screening for malignant neoplasm of colon: Secondary | ICD-10-CM | POA: Diagnosis present

## 2022-04-22 DIAGNOSIS — K5730 Diverticulosis of large intestine without perforation or abscess without bleeding: Secondary | ICD-10-CM

## 2022-04-22 DIAGNOSIS — K641 Second degree hemorrhoids: Secondary | ICD-10-CM

## 2022-04-22 MED ORDER — SODIUM CHLORIDE 0.9 % IV SOLN: 500.0000 mL | Freq: Once | INTRAVENOUS | Status: DC

## 2022-04-22 NOTE — Progress Notes (Signed)
Report to pacu rn. Vss. Care resumed by rn. 

## 2022-04-22 NOTE — Progress Notes (Signed)
Pt's states no medical or surgical changes since previsit or office visit. 

## 2022-04-22 NOTE — Op Note (Signed)
[facility] Patient Name: Kara Wilkinson Procedure Date: 04/22/2022 1:42 PM MRN: [id number] Endoscopist: Gerrit Heck , MD, [id number] Age: 47 Referring MD:  Date of Birth: 04/10/1976 Gender: Female Account #: [id number] Procedure:                Colonoscopy Indications:              Screening for colorectal malignant neoplasm, This                            is the patient's first colonoscopy Medicines:                Monitored Anesthesia Care Procedure:                Pre-Anesthesia Assessment:                           - Prior to the procedure, a History and Physical                            was performed, and patient medications and                            allergies were reviewed. The patient's tolerance of                            previous anesthesia was also reviewed. The risks                            and benefits of the procedure and the sedation                            options and risks were discussed with the patient.                            All questions were answered, and informed consent                            was obtained. Prior Anticoagulants: The patient has                            taken no anticoagulant or antiplatelet agents. ASA                            Grade Assessment: II - A patient with mild systemic                            disease. After reviewing the risks and benefits,                            the patient was deemed in satisfactory condition to                            undergo the procedure.  After obtaining informed consent, the colonoscope                            was passed under direct vision. Throughout the                            procedure, the patient's blood pressure, pulse, and                            oxygen saturations were monitored continuously. The                            Olympus CF-HQ190L [id number]) Colonoscope was                            introduced through the anus  and advanced to the the                            cecum, identified by appendiceal orifice and                            ileocecal valve. The colonoscopy was performed                            without difficulty. The patient tolerated the                            procedure well. The quality of the bowel                            preparation was good. The ileocecal valve,                            appendiceal orifice, and rectum were photographed. Scope In: 1:58:24 PM Scope Out: 2:10:38 PM Scope Withdrawal Time: 0 hours 9 minutes 17 seconds  Total Procedure Duration: 0 hours 12 minutes 14 seconds  Findings:                 The perianal and digital rectal examinations were                            normal.                           A 3 mm polyp was found in the transverse colon. The                            polyp was sessile. The polyp was removed with a                            cold snare. Resection and retrieval were complete.                           A few small-mouthed diverticula were found in the  sigmoid colon.                           Non-bleeding internal hemorrhoids were found during                            retroflexion. The hemorrhoids were small. Complications:            No immediate complications. Estimated Blood Loss:     Estimated blood loss was minimal. Impression:               - One 3 mm polyp in the transverse colon, removed                            with a cold snare. Resected and retrieved.                           - Diverticulosis in the sigmoid colon.                           - Non-bleeding internal hemorrhoids. Recommendation:           - Patient has a contact number available for                            emergencies. The signs and symptoms of potential                            delayed complications were discussed with the                            patient. Return to normal activities tomorrow.                             Written discharge instructions were provided to the                            patient.                           - Resume previous diet.                           - Continue present medications.                           - Await pathology results.                           - Repeat colonoscopy for surveillance based on                            pathology results.                           - Use fiber, for example Citrucel, Fibercon, [employer]  or Metamucil.                           - Internal hemorrhoids were noted on this study and                            may be amenable to hemorrhoid band ligation. If you                            are interested in further treatment of these                            hemorrhoids with band ligation, please contact my                            clinic to set up an appointment for evaluation and                            treatment.                           - Return to GI clinic PRN. Gerrit Heck, MD 04/22/2022 2:14:42 PM

## 2022-04-22 NOTE — Patient Instructions (Signed)
Impression/Recommendations:  Polyp, diverticulosis, and hemorrhoid handouts given to patient.  Resume previous diet. Continue present medications. Await pathology results.  Use fiber (Citrucel,Fibercon, Konsyl or Metamucil).  If you are interested in treatment of hemorrhoids, call Dr. Vivia Ewing office to arrange evaluation and treatment.  Return to GI clinic as needed.  YOU HAD AN ENDOSCOPIC PROCEDURE TODAY AT [facility]:   Refer to the procedure report that was given to you for any specific questions about what was found during the examination.  If the procedure report does not answer your questions, please call your gastroenterologist to clarify.  If you requested that your care partner not be given the details of your procedure findings, then the procedure report has been included in a sealed envelope for you to review at your convenience later.  YOU SHOULD EXPECT: Some feelings of bloating in the abdomen. Passage of more gas than usual.  Walking can help get rid of the air that was put into your GI tract during the procedure and reduce the bloating. If you had a lower endoscopy (such as a colonoscopy or flexible sigmoidoscopy) you may notice spotting of blood in your stool or on the toilet paper. If you underwent a bowel prep for your procedure, you may not have a normal bowel movement for a few days.  Please Note:  You might notice some irritation and congestion in your nose or some drainage.  This is from the oxygen used during your procedure.  There is no need for concern and it should clear up in a day or so.  SYMPTOMS TO REPORT IMMEDIATELY:  Following lower endoscopy (colonoscopy or flexible sigmoidoscopy):  Excessive amounts of blood in the stool  Significant tenderness or worsening of abdominal pains  Swelling of the abdomen that is new, acute  Fever of 100F or higher For urgent or emergent issues, a gastroenterologist can be reached at any hour by calling  [phone]. Do not use MyChart messaging for urgent concerns.    DIET:  We do recommend a small meal at first, but then you may proceed to your regular diet.  Drink plenty of fluids but you should avoid alcoholic beverages for 24 hours.  ACTIVITY:  You should plan to take it easy for the rest of today and you should NOT DRIVE or use heavy machinery until tomorrow (because of the sedation medicines used during the test).    FOLLOW UP: Our staff will call the number listed on your records the next business day following your procedure.  We will call around 7:15- 8:00 am to check on you and address any questions or concerns that you may have regarding the information given to you following your procedure. If we do not reach you, we will leave a message.     If any biopsies were taken you will be contacted by phone or by letter within the next 1-3 weeks.  Please call us at [phone] if you have not heard about the biopsies in 3 weeks.    SIGNATURES/CONFIDENTIALITY: You and/or your care partner have signed paperwork which will be entered into your electronic medical record.  These signatures attest to the fact that that the information above on your After Visit Summary has been reviewed and is understood.  Full responsibility of the confidentiality of this discharge information lies with you and/or your care-partner.

## 2022-04-22 NOTE — Progress Notes (Signed)
GASTROENTEROLOGY PROCEDURE H&P NOTE   Primary Care Physician: Elby Showers, MD    Reason for Procedure:  Colon Cancer screening  Plan:    Colonoscopy  Patient is appropriate for endoscopic procedure(s) in the ambulatory ([facility]) setting.  The nature of the procedure, as well as the risks, benefits, and alternatives were carefully and thoroughly reviewed with the patient. Ample time for discussion and questions allowed. The patient understood, was satisfied, and agreed to proceed.     HPI: Kara Wilkinson is a 47 y.o. female who presents for colonoscopy for routine Colon Cancer screening.  No active GI symptoms.  No known family history of colon cancer or related malignancy.  Patient is otherwise without complaints or active issues today.  Past Medical History:  Diagnosis Date   Anemia    Hyperlipidemia    Sleep apnea    wear c pap    Past Surgical History:  Procedure Laterality Date   WISDOM TOOTH EXTRACTION      Prior to Admission medications   Medication Sig Start Date End Date Taking? Authorizing Provider  levonorgestrel (MIRENA) 20 MCG/24HR IUD 1 each by Intrauterine route once.   Yes [provider]  rosuvastatin (CRESTOR) 5 MG tablet Take 1 tablet (5 mg total) by mouth daily. 02/04/22  Yes Baxley, Cresenciano Lick, MD  ALPRAZolam Duanne Moron) 0.25 MG tablet One by mouth twice daily as needed for vertigo Patient taking differently: 2 (two) times daily as needed. One by mouth twice daily as needed for vertigo 02/05/22   Elby Showers, MD    Current Outpatient Medications  Medication Sig Dispense Refill   levonorgestrel (MIRENA) 20 MCG/24HR IUD 1 each by Intrauterine route once.     rosuvastatin (CRESTOR) 5 MG tablet Take 1 tablet (5 mg total) by mouth daily. 90 tablet 3   ALPRAZolam (XANAX) 0.25 MG tablet One by mouth twice daily as needed for vertigo (Patient taking differently: 2 (two) times daily as needed. One by mouth twice daily as needed for vertigo) 60 tablet 1    Current Facility-Administered Medications  Medication Dose Route Frequency Provider Last Rate Last Admin   0.9 %  sodium chloride infusion  500 mL Intravenous Once Inika Bellanger V, DO        Allergies as of 04/22/2022 - Review Complete 04/22/2022  Allergen Reaction Noted   Amoxicillin Hives 05/19/2014    Family History  Problem Relation Age of Onset   Breast cancer Mother    Colon polyps Mother    Colon polyps Father    Kidney disease Father    Diabetes Father    Bladder Cancer Father    Colon cancer Neg Hx    Crohn's disease Neg Hx    Esophageal cancer Neg Hx    Rectal cancer Neg Hx    Stomach cancer Neg Hx    Ulcerative colitis Neg Hx     Social History   Socioeconomic History   Marital status: Married    Spouse name: Not on file   Number of children: Not on file   Years of education: Not on file   Highest education level: Not on file  Occupational History   Not on file  Tobacco Use   Smoking status: Never    Passive exposure: Past (father smoked)   Smokeless tobacco: Never  Vaping Use   Vaping Use: Never used  Substance and Sexual Activity   Alcohol use: Not Currently   Drug use: Never   Sexual activity: Not  on file  Other Topics Concern   Not on file  Social History Narrative   Not on file   Social Determinants of Health   Financial Resource Strain: Not on file  Food Insecurity: Not on file  Transportation Needs: Not on file  Physical Activity: Not on file  Stress: Not on file  Social Connections: Not on file  Intimate Partner Violence: Not on file    Physical Exam: Vital signs in last 24 hours: '@BP'$  126/70   Pulse 75   Temp 98.6 F (37 C)   Ht '5\' 5"'$  (1.651 m)   Wt 288 lb (130.6 kg)   SpO2 98%   BMI 47.93 kg/m  GEN: NAD EYE: Sclerae anicteric ENT: MMM CV: Non-tachycardic Pulm: CTA b/l GI: Soft, NT/ND NEURO:  Alert & Oriented x 3   Gerrit Heck, DO [facility]   04/22/2022 1:50 PM

## 2022-04-23 ENCOUNTER — Telehealth: Payer: Self-pay | Admitting: *Deleted

## 2022-04-23 NOTE — Telephone Encounter (Signed)
  Follow up Call-     04/22/2022    1:45 PM  Call back number  Post procedure Call Back phone  # [phone]  Permission to leave phone message Yes     Patient questions:  Do you have a fever, pain , or abdominal swelling? No. Pain Score  0 *  Have you tolerated food without any problems? Yes.    Have you been able to return to your normal activities? Yes.    Do you have any questions about your discharge instructions: Diet   No. Medications  No. Follow up visit  No.  Do you have questions or concerns about your Care? No.  Actions: * If pain score is 4 or above: No action needed, pain <4.

## 2022-04-29 ENCOUNTER — Other Ambulatory Visit: Payer: Self-pay | Admitting: Internal Medicine

## 2022-05-06 ENCOUNTER — Encounter: Payer: Self-pay | Admitting: Gastroenterology

## 2022-07-22 ENCOUNTER — Telehealth: Payer: Self-pay | Admitting: Internal Medicine

## 2022-07-22 NOTE — Telephone Encounter (Signed)
Scheduled after talking to Dr Lenord Fellers

## 2022-07-22 NOTE — Telephone Encounter (Signed)
Kara Wilkinson [phone]  [name] called to say dor the last couple of weeks she has been having daily headaches, they are getting worse instead of better, she also is having some face pressure.  She has not been sick recently. Would like to come

## 2022-07-23 ENCOUNTER — Ambulatory Visit (INDEPENDENT_AMBULATORY_CARE_PROVIDER_SITE_OTHER): Payer: 59 | Admitting: Internal Medicine

## 2022-07-23 ENCOUNTER — Encounter: Payer: Self-pay | Admitting: Internal Medicine

## 2022-07-23 VITALS — BP 130/84 | HR 76 | Temp 99.3°F | Ht 65.0 in | Wt 292.1 lb

## 2022-07-23 DIAGNOSIS — R519 Headache, unspecified: Secondary | ICD-10-CM | POA: Diagnosis not present

## 2022-07-23 MED ORDER — HYDROCODONE-ACETAMINOPHEN 5-325 MG PO TABS: ORAL_TABLET | ORAL | 0 refills | Status: DC

## 2022-07-23 MED ORDER — METHYLPREDNISOLONE 4 MG PO TABS: ORAL_TABLET | ORAL | 0 refills | Status: DC

## 2022-07-23 NOTE — Progress Notes (Signed)
Patient Care Team: Margaree Mackintosh, MD as PCP - General (Internal Medicine)  Visit Date: 07/23/22  Subjective:    Patient ID: Kara Wilkinson , Female   DOB: [birth year], 47 y.o.    MRN: [id number]   47 y.o. Female presents today for protracted headaches. She has had isolated headaches in the past but not often. Episodes of headaches usually last for a couple of days but this time she has been having continuous headaches for the past 21 days. Reports no changes in stress. Uses CPAP nightly. Sleep is normal. Cannot wear glasses due to pressure on head. Has not had any recent vertigo episodes. Taking Tylenol with inadequate relief. Has some nausea, vomiting. Her sister also gets migraine headaches. Has some numbness in left leg, denies weakness. Eating habits have been normal. Has some neck pain. Thyroid, liver, kidney function normal, glucose normal in 10/23. Denies soar throat. Having some balance issues.  Past Medical History:  Diagnosis Date   Anemia    Hyperlipidemia    Sleep apnea    wear c pap     Family History  Problem Relation Age of Onset   Breast cancer Mother    Colon polyps Mother    Colon polyps Father    Kidney disease Father    Diabetes Father    Bladder Cancer Father    Colon cancer Neg Hx    Crohn's disease Neg Hx    Esophageal cancer Neg Hx    Rectal cancer Neg Hx    Stomach cancer Neg Hx    Ulcerative colitis Neg Hx     Social History   Social History Narrative   Not on file      Review of Systems  Constitutional:  Negative for fever and malaise/fatigue.  HENT:  Negative for congestion and sore throat.   Eyes:  Negative for blurred vision.  Respiratory:  Negative for cough and shortness of breath.   Cardiovascular:  Negative for chest pain, palpitations and leg swelling.  Gastrointestinal:  Positive for nausea and vomiting.  Musculoskeletal:  Positive for neck pain. Negative for back pain.  Skin:  Negative for rash.  Neurological:  Positive for  tingling (Left leg). Negative for loss of consciousness, weakness and headaches.        Objective:   Vitals: BP 130/84   Pulse 76   Temp 99.3 F (37.4 C) (Tympanic)   Ht 5\' 5"  (1.651 m)   Wt 292 lb 1.9 oz (132.5 kg)   SpO2 99%   BMI 48.61 kg/m    Physical Exam Vitals and nursing note reviewed.  Constitutional:      General: She is not in acute distress.    Appearance: Normal appearance. She is not toxic-appearing.  HENT:     Head: Normocephalic and atraumatic.  Eyes:     Extraocular Movements: Extraocular movements intact.     Pupils: Pupils are equal, round, and reactive to light.  Neck:     Thyroid: No thyroid mass, thyromegaly or thyroid tenderness.     Vascular: No carotid bruit.     Comments: Small anterior cervical nodes. Cardiovascular:     Rate and Rhythm: Normal rate. Rhythm irregular. No extrasystoles are present.    Pulses: Normal pulses.     Heart sounds: Normal heart sounds. No murmur heard.    No friction rub. No gallop.     Comments: Occasional irregular contractions. Pulmonary:     Effort: Pulmonary effort is normal. No respiratory distress.  Breath sounds: Normal breath sounds. No wheezing or rales.  Lymphadenopathy:     Cervical: Cervical adenopathy present.  Skin:    General: Skin is warm and dry.  Neurological:     Mental Status: She is alert and oriented to person, place, and time. Mental status is at baseline.     Cranial Nerves: Cranial nerves 2-12 are intact.     Sensory: Sensation is intact.     Coordination: Coordination is intact.     Deep Tendon Reflexes:     Reflex Scores:      Bicep reflexes are 1+ on the right side and 1+ on the left side.      Brachioradialis reflexes are 1+ on the right side and 1+ on the left side. Psychiatric:        Mood and Affect: Mood normal.        Behavior: Behavior normal.        Thought Content: Thought content normal.        Judgment: Judgment normal.       Results:   Studies obtained and  personally reviewed by me:   Labs:       Component Value Date/Time   NA 140 01/31/2022 0939   K 4.5 01/31/2022 0939   CL 103 01/31/2022 0939   CO2 27 01/31/2022 0939   GLUCOSE 96 01/31/2022 0939   BUN 9 01/31/2022 0939   CREATININE 0.77 01/31/2022 0939   CALCIUM 9.1 01/31/2022 0939   PROT 7.7 01/31/2022 0939   ALBUMIN 3.9 05/17/2014 0915   AST 19 01/31/2022 0939   ALT 22 01/31/2022 0939   ALKPHOS 80 05/17/2014 0915   BILITOT 1.1 01/31/2022 0939     Lab Results  Component Value Date   WBC 7.2 01/31/2022   HGB 13.4 01/31/2022   HCT 41.9 01/31/2022   MCV 95.0 01/31/2022   PLT 345 01/31/2022    Lab Results  Component Value Date   CHOL 236 (H) 01/31/2022   HDL 39 (L) 01/31/2022   LDLCALC 170 (H) 01/31/2022   TRIG 135 01/31/2022   CHOLHDL 6.1 (H) 01/31/2022    No results found for: "HGBA1C"   Lab Results  Component Value Date   TSH 2.63 01/31/2022      Assessment & Plan:   Protracted headache: prescribed Medrol dosepak tapering course take as directed, Norco 5-325 mg every 8 hours for headache. Ordered CBC with Diff/Plat, CMET, TSH.    I,Alexander Ruley,acting as a Neurosurgeon for Margaree Mackintosh, MD.,have documented all relevant documentation on the behalf of Margaree Mackintosh, MD,as directed by  Margaree Mackintosh, MD while in the presence of Margaree Mackintosh, MD.   I, Margaree Mackintosh, MD, have reviewed all documentation for this visit. The documentation on 08/10/22 for the exam, diagnosis, procedures, and orders are all accurate and complete.

## 2022-07-24 ENCOUNTER — Telehealth: Payer: Self-pay

## 2022-07-24 ENCOUNTER — Encounter: Payer: Self-pay | Admitting: Internal Medicine

## 2022-07-24 ENCOUNTER — Telehealth: Payer: Self-pay | Admitting: Internal Medicine

## 2022-07-24 LAB — COMPLETE METABOLIC PANEL WITH GFR
AG Ratio: 1.2 (calc) (ref 1.0–2.5)
ALT: 26 U/L (ref 6–29)
AST: 19 U/L (ref 10–35)
Albumin: 4 g/dL (ref 3.6–5.1)
Alkaline phosphatase (APISO): 76 U/L (ref 31–125)
BUN: 11 mg/dL (ref 7–25)
CO2: 26 mmol/L (ref 20–32)
Calcium: 8.9 mg/dL (ref 8.6–10.2)
Chloride: 107 mmol/L (ref 98–110)
Creat: 0.77 mg/dL (ref 0.50–0.99)
Globulin: 3.3 g/dL (calc) (ref 1.9–3.7)
Glucose, Bld: 106 mg/dL — ABNORMAL HIGH (ref 65–99)
Potassium: 4 mmol/L (ref 3.5–5.3)
Sodium: 142 mmol/L (ref 135–146)
Total Bilirubin: 0.8 mg/dL (ref 0.2–1.2)
Total Protein: 7.3 g/dL (ref 6.1–8.1)
eGFR: 95 mL/min/{1.73_m2} (ref >=60)

## 2022-07-24 LAB — CBC WITH DIFFERENTIAL/PLATELET
Absolute Monocytes: 319 cells/uL (ref 200–950)
Basophils Absolute: 41 cells/uL (ref 0–200)
Basophils Relative: 0.7 %
Eosinophils Absolute: 112 cells/uL (ref 15–500)
Eosinophils Relative: 1.9 %
HCT: 41.1 % (ref 35.0–45.0)
Hemoglobin: 13.3 g/dL (ref 11.7–15.5)
Lymphs Abs: 2095 cells/uL (ref 850–3900)
MCH: 29.9 pg (ref 27.0–33.0)
MCHC: 32.4 g/dL (ref 32.0–36.0)
MCV: 92.4 fL (ref 80.0–100.0)
MPV: 10.1 fL (ref 7.5–12.5)
Monocytes Relative: 5.4 %
Neutro Abs: 3334 cells/uL (ref 1500–7800)
Neutrophils Relative %: 56.5 %
Platelets: 319 10*3/uL (ref 140–400)
RBC: 4.45 10*6/uL (ref 3.80–5.10)
RDW: 12.6 % (ref 11.0–15.0)
Total Lymphocyte: 35.5 %
WBC: 5.9 10*3/uL (ref 3.8–10.8)

## 2022-07-24 LAB — TSH: TSH: 2.34 mIU/L

## 2022-07-24 NOTE — Telephone Encounter (Signed)
Norco 5/325  x 1 did not relieve headache. Not nauseated. Took Medrol yesterday. Still has headache. Have advised she can take 2 Norco 5/325 tabs with some food this am. If not improving, then I would like for her to go to [facility] ED for evaluation today. Pt. Understands this recommendation and instructions. MJB, MD

## 2022-07-24 NOTE — Telephone Encounter (Signed)
Patient called this morning stating she still has the headache and the HYDROcodone-acetaminophen (NORCO) 5-325 MG tablet is not working and does not touch the headache at all and would like to know if a different Rx can be sent in.

## 2022-07-25 ENCOUNTER — Encounter (HOSPITAL_BASED_OUTPATIENT_CLINIC_OR_DEPARTMENT_OTHER): Payer: Self-pay

## 2022-07-25 ENCOUNTER — Emergency Department (HOSPITAL_BASED_OUTPATIENT_CLINIC_OR_DEPARTMENT_OTHER)
Admission: EM | Admit: 2022-07-25 | Discharge: 2022-07-25 | Disposition: A | Discharge status: Home / Self Care | Payer: 59 | Attending: Emergency Medicine | Admitting: Emergency Medicine

## 2022-07-25 ENCOUNTER — Emergency Department (HOSPITAL_BASED_OUTPATIENT_CLINIC_OR_DEPARTMENT_OTHER): Payer: 59

## 2022-07-25 ENCOUNTER — Other Ambulatory Visit: Payer: Self-pay

## 2022-07-25 DIAGNOSIS — R110 Nausea: Secondary | ICD-10-CM | POA: Insufficient documentation

## 2022-07-25 DIAGNOSIS — E871 Hypo-osmolality and hyponatremia: Secondary | ICD-10-CM | POA: Insufficient documentation

## 2022-07-25 DIAGNOSIS — D72829 Elevated white blood cell count, unspecified: Secondary | ICD-10-CM | POA: Diagnosis not present

## 2022-07-25 DIAGNOSIS — R519 Headache, unspecified: Secondary | ICD-10-CM | POA: Diagnosis present

## 2022-07-25 LAB — CBC WITH DIFFERENTIAL/PLATELET
Abs Immature Granulocytes: 0.05 10*3/uL (ref 0.00–0.07)
Basophils Absolute: 0.1 10*3/uL (ref 0.0–0.1)
Basophils Relative: 0 %
Eosinophils Absolute: 0 10*3/uL (ref 0.0–0.5)
Eosinophils Relative: 0 %
HCT: 37.6 % (ref 36.0–46.0)
Hemoglobin: 12.3 g/dL (ref 12.0–15.0)
Immature Granulocytes: 0 %
Lymphocytes Relative: 30 %
Lymphs Abs: 3.6 10*3/uL (ref 0.7–4.0)
MCH: 30 pg (ref 26.0–34.0)
MCHC: 32.7 g/dL (ref 30.0–36.0)
MCV: 91.7 fL (ref 80.0–100.0)
Monocytes Absolute: 0.8 10*3/uL (ref 0.1–1.0)
Monocytes Relative: 6 %
Neutro Abs: 7.7 10*3/uL (ref 1.7–7.7)
Neutrophils Relative %: 64 %
Platelets: 333 10*3/uL (ref 150–400)
RBC: 4.1 MIL/uL (ref 3.87–5.11)
RDW: 13.2 % (ref 11.5–15.5)
WBC: 12.2 10*3/uL — ABNORMAL HIGH (ref 4.0–10.5)
nRBC: 0 % (ref 0.0–0.2)

## 2022-07-25 LAB — BASIC METABOLIC PANEL
Anion gap: 8 (ref 5–15)
BUN: 15 mg/dL (ref 6–20)
CO2: 26 mmol/L (ref 22–32)
Calcium: 8.9 mg/dL (ref 8.9–10.3)
Chloride: 104 mmol/L (ref 98–111)
Creatinine, Ser: 0.7 mg/dL (ref 0.44–1.00)
GFR, Estimated: >60 mL/min (ref >60)
Glucose, Bld: 78 mg/dL (ref 70–99)
Potassium: 3.8 mmol/L (ref 3.5–5.1)
Sodium: 138 mmol/L (ref 135–145)

## 2022-07-25 LAB — HCG, SERUM, QUALITATIVE: Preg, Serum: NEGATIVE

## 2022-07-25 MED ORDER — DEXAMETHASONE SODIUM PHOSPHATE 10 MG/ML IJ SOLN
10.0000 mg | Freq: Once | INTRAMUSCULAR | Status: AC
  Administered 2022-07-25: 10 mg via INTRAVENOUS
  Filled 2022-07-25: qty 1

## 2022-07-25 MED ORDER — PROCHLORPERAZINE EDISYLATE 10 MG/2ML IJ SOLN
10.0000 mg | Freq: Once | INTRAMUSCULAR | Status: AC
  Administered 2022-07-25: 10 mg via INTRAVENOUS
  Filled 2022-07-25: qty 2

## 2022-07-25 MED ORDER — DIPHENHYDRAMINE HCL 50 MG/ML IJ SOLN
25.0000 mg | Freq: Once | INTRAMUSCULAR | Status: AC
  Administered 2022-07-25: 25 mg via INTRAVENOUS
  Filled 2022-07-25: qty 1

## 2022-07-25 MED ORDER — IOHEXOL 350 MG/ML SOLN
100.0000 mL | Freq: Once | INTRAVENOUS | Status: AC | PRN
  Administered 2022-07-25: 75 mL via INTRAVENOUS

## 2022-07-25 MED ORDER — SODIUM CHLORIDE 0.9 % IV BOLUS
1000.0000 mL | Freq: Once | INTRAVENOUS | Status: AC
  Administered 2022-07-25: 1000 mL via INTRAVENOUS

## 2022-07-25 MED ORDER — MAGNESIUM SULFATE 2 GM/50ML IV SOLN
2.0000 g | Freq: Once | INTRAVENOUS | Status: AC
  Administered 2022-07-25: 2 g via INTRAVENOUS
  Filled 2022-07-25: qty 50

## 2022-07-25 NOTE — ED Triage Notes (Signed)
Patient here POV from Home.  Endorses Headache for 3 Weeks. No Recent N/V. No Fever. Some Photosensitivity.   Seen by PCP on Tuesday and given Prednisone and Narcotic Pain Medication without much relief.   NAD Noted during Triage. A&OX4. GCS 15. Ambulatory.

## 2022-07-25 NOTE — ED Provider Notes (Signed)
[place] EMERGENCY DEPARTMENT AT [facility] Provider Note   CSN: [id number] Arrival date & time: 07/25/22  1144     History  Chief Complaint  Patient presents with   Headache    ANUM PALECEK is a 47 y.o. female.  LEDORA DELKER is a 47 y.o. female with a history of anemia, sleep apnea, and hyperlipidemia, who presents to the ED for evaluation of persistent headache.  Patient reports she has been having a headache for the past 3 weeks.  She reports it will wax and wane in intensity but never completely goes away.  She describes it as a throbbing headache primarily in the front of her head.  Headache is sometimes worse with position change.  She reports that when it initially started it came on gradually, it was not the worst headache she is ever had in her life but it has lasted longer than any headache she has had before.  She has had some nausea but no vomiting.  Reports light sensitivity and reports cutaneous allodynia, no photophobia.  No other visual changes.  No fevers.  No neck pain or neck stiffness.  No associated syncope, no dizziness.  No numbness or weakness.  She saw her primary care doctor 2 days ago and was prescribed prednisone and hydrocodone and reports that this is given her minimal if any relief and the headache is still not completely resolved.  She reports she has had headaches intermittently previously but has never been diagnosed with migraines or any other headache condition.  No other aggravating or alleviating factors.  The history is provided by the patient, medical records and the spouse.  Headache Associated symptoms: photophobia   Associated symptoms: no abdominal pain, no cough, no dizziness, no eye pain, no fever, no neck pain, no neck stiffness, no numbness, no seizures, no vomiting and no weakness        Home Medications Prior to Admission medications   Medication Sig Start Date End Date Taking? Authorizing Provider  ALPRAZolam Prudy Feeler)  0.25 MG tablet TAKE 1 TABLET BY MOUTH 2 TIMES DAILY AS NEEDED FOR vertigo 04/29/22   Margaree Mackintosh, MD  HYDROcodone-acetaminophen [facility] 5-325 MG tablet One tab  by mouth every 8 hours  for headache 07/23/22   Margaree Mackintosh, MD  levonorgestrel (MIRENA) 20 MCG/24HR IUD 1 each by Intrauterine route once.    [provider]  methylPREDNISolone (MEDROL) 4 MG tablet Take in tapering course as directed 6-5-4-3-2-1 07/23/22   Margaree Mackintosh, MD  rosuvastatin (CRESTOR) 5 MG tablet Take 1 tablet (5 mg total) by mouth daily. 02/04/22   Margaree Mackintosh, MD      Allergies    Amoxicillin    Review of Systems   Review of Systems  Constitutional:  Negative for chills and fever.  HENT: Negative.    Eyes:  Positive for photophobia. Negative for pain and visual disturbance.  Respiratory:  Negative for cough and shortness of breath.   Cardiovascular:  Negative for chest pain.  Gastrointestinal:  Negative for abdominal pain and vomiting.  Musculoskeletal:  Negative for neck pain and neck stiffness.  Skin:  Negative for rash.  Neurological:  Positive for headaches. Negative for dizziness, seizures, syncope, facial asymmetry, speech difficulty, weakness, light-headedness and numbness.  All other systems reviewed and are negative.   Physical Exam Updated Vital Signs BP (!) 144/80 (BP Location: Right Arm)   Pulse 62   Temp 97.9 F (36.6 C)   Resp 16  Ht 5\' 5"  (1.651 m)   Wt 131.5 kg   SpO2 94%   BMI 48.26 kg/m  Physical Exam Vitals and nursing note reviewed.  Constitutional:      General: She is not in acute distress.    Appearance: Normal appearance. She is well-developed. She is not ill-appearing or diaphoretic.  HENT:     Head: Normocephalic and atraumatic.  Eyes:     General:        Right eye: No discharge.        Left eye: No discharge.     Extraocular Movements: Extraocular movements intact.     Right eye: No nystagmus.     Left eye: No nystagmus.     Pupils: Pupils are equal,  round, and reactive to light.  Neck:     Meningeal: Brudzinski's sign and Kernig's sign absent.  Cardiovascular:     Rate and Rhythm: Normal rate and regular rhythm.     Heart sounds: Normal heart sounds.  Pulmonary:     Effort: Pulmonary effort is normal. No respiratory distress.     Breath sounds: Normal breath sounds.  Musculoskeletal:     Cervical back: Normal range of motion. No rigidity.  Skin:    General: Skin is warm and dry.  Neurological:     Mental Status: She is alert and oriented to person, place, and time.     GCS: GCS eye subscore is 4. GCS verbal subscore is 5. GCS motor subscore is 6.     Coordination: Coordination normal.     Comments: Neurological Exam:  Mental Status: Alert and oriented to person, place, and time. Attention and concentration normal. Speech clear. Recent memory is intact. Follows commands. Cranial Nerves: Visual fields grossly intact. EOMI and PERRLA. No nystagmus noted. Facial sensation intact at forehead, maxillary cheek, and chin/mandible bilaterally. No facial asymmetry or weakness. Hearing grossly normal. Uvula is midline, and palate elevates symmetrically. Normal SCM and trapezius strength. Tongue midline without fasciculations. Motor: Muscle strength 5/5 in proximal and distal UE and LE bilaterally. No pronator drift. Muscle tone normal.  Sensation: Intact to light touch in upper and lower extremities distally bilaterally.  Gait: Normal without ataxia. Coordination: Normal FTN bilaterally.  Psychiatric:        Mood and Affect: Mood normal.        Behavior: Behavior normal.     ED Results / Procedures / Treatments   Labs (all labs ordered are listed, but only abnormal results are displayed) Labs Reviewed  CBC WITH DIFFERENTIAL/PLATELET - Abnormal; Notable for the following components:      Result Value   WBC 12.2 (*)    All other components within normal limits  BASIC METABOLIC PANEL  HCG, SERUM, QUALITATIVE     EKG None  Radiology CT Angio Head W or Wo Contrast  Result Date: 07/25/2022 CLINICAL DATA:  Subarachnoid hemorrhage [facility]); Dural venous sinus thrombosis suspected EXAM: CT ANGIOGRAPHY HEAD CT VENOGRAM HEAD TECHNIQUE: Multidetector CT imaging of the head was performed using the standard protocol during bolus administration of intravenous contrast. Multiplanar CT image reconstructions and MIPs were obtained to evaluate the vascular anatomy. Venographic phase images of the brain were obtained following the administration of intravenous contrast. Multiplanar reformats and maximum intensity projections were generated. RADIATION DOSE REDUCTION: This exam was performed according to the departmental dose-optimization program which includes automated exposure control, adjustment of the mA and/or kV according to patient size and/or use of iterative reconstruction technique. CONTRAST:  75mL OMNIPAQUE IOHEXOL 350  MG/ML SOLN COMPARISON:  None Available. FINDINGS: CT HEAD Brain: No evidence of acute infarction, hemorrhage, hydrocephalus, extra-axial collection or mass lesion/mass effect. Vascular: See below. Skull: No acute fracture. Sinuses: No acute or significant finding. CTA HEAD Anterior circulation: Bilateral intracranial ICAs, MCAs, and ACAs are patent without proximal hemodynamically significant stenosis. No aneurysm identified. Posterior circulation: Bilateral intradural vertebral arteries, basilar artery and bilateral posterior cerebral arteries are patent without proximal hemodynamically significant stenosis. No aneurysm identified. Venous sinuses: See below. Review of the MIP images confirms the above findings. CTV HEAD No evidence of dural venous sinus thrombosis. The superior sagittal, sigmoid, transverse, straight and visualized deep cerebral veins are patent. IMPRESSION: 1. No evidence of acute intracranial abnormality. 2. No large vessel occlusion or proximal hemodynamically significant stenosis. No  aneurysm identified. 3. No evidence of dural venous sinus thrombosis. Electronically Signed   By: Feliberto Harts M.D.   On: 07/25/2022 14:55   CT VENOGRAM HEAD  Result Date: 07/25/2022 CLINICAL DATA:  Subarachnoid hemorrhage [facility]); Dural venous sinus thrombosis suspected EXAM: CT ANGIOGRAPHY HEAD CT VENOGRAM HEAD TECHNIQUE: Multidetector CT imaging of the head was performed using the standard protocol during bolus administration of intravenous contrast. Multiplanar CT image reconstructions and MIPs were obtained to evaluate the vascular anatomy. Venographic phase images of the brain were obtained following the administration of intravenous contrast. Multiplanar reformats and maximum intensity projections were generated. RADIATION DOSE REDUCTION: This exam was performed according to the departmental dose-optimization program which includes automated exposure control, adjustment of the mA and/or kV according to patient size and/or use of iterative reconstruction technique. CONTRAST:  64mL OMNIPAQUE IOHEXOL 350 MG/ML SOLN COMPARISON:  None Available. FINDINGS: CT HEAD Brain: No evidence of acute infarction, hemorrhage, hydrocephalus, extra-axial collection or mass lesion/mass effect. Vascular: See below. Skull: No acute fracture. Sinuses: No acute or significant finding. CTA HEAD Anterior circulation: Bilateral intracranial ICAs, MCAs, and ACAs are patent without proximal hemodynamically significant stenosis. No aneurysm identified. Posterior circulation: Bilateral intradural vertebral arteries, basilar artery and bilateral posterior cerebral arteries are patent without proximal hemodynamically significant stenosis. No aneurysm identified. Venous sinuses: See below. Review of the MIP images confirms the above findings. CTV HEAD No evidence of dural venous sinus thrombosis. The superior sagittal, sigmoid, transverse, straight and visualized deep cerebral veins are patent. IMPRESSION: 1. No evidence of acute  intracranial abnormality. 2. No large vessel occlusion or proximal hemodynamically significant stenosis. No aneurysm identified. 3. No evidence of dural venous sinus thrombosis. Electronically Signed   By: Feliberto Harts M.D.   On: 07/25/2022 14:55     Procedures Procedures    Medications Ordered in ED Medications  sodium chloride 0.9 % bolus 1,000 mL (0 mLs Intravenous Stopped 07/25/22 1424)  dexamethasone (DECADRON) injection 10 mg (10 mg Intravenous Given 07/25/22 1319)  magnesium sulfate IVPB 2 g 50 mL (0 g Intravenous Stopped 07/25/22 1343)  prochlorperazine (COMPAZINE) injection 10 mg (10 mg Intravenous Given 07/25/22 1320)  diphenhydrAMINE (BENADRYL) injection 25 mg (25 mg Intravenous Given 07/25/22 1318)  iohexol (OMNIPAQUE) 350 MG/ML injection 100 mL (75 mLs Intravenous Contrast Given 07/25/22 1427)    ED Course/ Medical Decision Making/ A&P                             Medical Decision Making Amount and/or Complexity of Data Reviewed Labs: ordered. Radiology: ordered.  Risk Prescription drug management.   47 y.o. female presents to the ED with complaints of headaches, this involves  an extensive number of treatment options, and is a complaint that carries with it a high risk of complications and morbidity.  The differential diagnosis includes migraine headache with status migrainosus, tension headache, venous sinus thrombosis, aneurysm, subarachnoid hemorrhage, intracranial mass, hyponatremia  On arrival pt is nontoxic, vitals significant for hypertension, on arrival blood pressure of 183/102, with treatment of headache though blood pressure significantly improved, only mildly elevated at 144/80, vitals otherwise normal. Exam without neural object deficit  Additional history obtained from spouse at bedside. Previous records obtained and reviewed recent PCP note  I ordered headache cocktail including IV fluids, Benadryl, Compazine, Decadron and magnesium  Lab Tests:  I  Ordered, reviewed, and interpreted labs, which included: Minimal leukocytosis of 12.2 but patient was recently on steroids, normal hemoglobin, no electrolyte derangements and normal renal function, negative pregnancy.  Imaging Studies ordered:  I ordered imaging studies which included CTA and CTV of the head, I independently visualized and interpreted imaging which showed no evidence of aneurysm or bleed and no evidence of venous sinus thrombosis, no intracranial mass  ED Course:   On reevaluation patient's headache has completely resolved with headache cocktail.  Discussed reassuring imaging and lab work.  Recommend continued symptomatic treatment as needed and outpatient neurology follow-up if headaches persist.  Question whether this may have been status migrainosus, considered IIH but given complete resolution of symptoms and without associated visual complaints feel this is less likely.  Will refer to neurology for further evaluation of headaches but feel patient is appropriate for discharge home at this time.  Patient and husband are in agreement.  Discharged home in good condition.   Portions of this note were generated with Scientist, clinical (histocompatibility and immunogenetics). Dictation errors may occur despite best attempts at proofreading.         Final Clinical Impression(s) / ED Diagnoses Final diagnoses:  Bad headache    Rx / DC Orders ED Discharge Orders     None         Dartha Lodge, PA-C 08/01/22 [id number]    Rexford Maus, DO 08/01/22 1615

## 2022-07-25 NOTE — ED Notes (Signed)
Discharge paperwork given and verbally understood. 

## 2022-07-25 NOTE — Telephone Encounter (Signed)
Patient called back to say that doubling up on the medication made the headache go away, however when the medication wore off the headache was back. Should she go ahead and go to [facility] or keep taking 2 of the medication?

## 2022-07-25 NOTE — Discharge Instructions (Signed)
Your imaging and lab work today was overall reassuring.  Symptoms could be migraine or tension headaches.  If you continue having persistent headaches you should follow-up with neurology otherwise you can continue following with your primary care provider for further management.  If you feel a headache coming back I recommend taking Tylenol as soon as you feel headache starting, do not wait until headache worsens.  If you develop sudden onset worst headache of your life, fevers, numbness, weakness, visual changes or other new or concerning symptoms return to the emergency department.

## 2022-07-26 ENCOUNTER — Telehealth: Payer: Self-pay

## 2022-07-26 NOTE — Telephone Encounter (Signed)
Patient went to [facility] as Dr Lenord Fellers had advised

## 2022-07-26 NOTE — Transitions of Care (Post Inpatient/ED Visit) (Signed)
   07/26/2022  Name: Kara Wilkinson MRN: [id number] DOB: 05/25/1975  Today's TOC FU Call Status: Today's TOC FU Call Status:: Unsuccessul Call (1st Attempt) Unsuccessful Call (1st Attempt) Date: 07/26/22  Attempted to reach the patient regarding the most recent Inpatient/ED visit.  Follow Up Plan: Additional outreach attempts will be made to reach the patient to complete the Transitions of Care (Post Inpatient/ED visit) call.   Signature  Paula Compton, CMA

## 2022-07-29 ENCOUNTER — Telehealth: Payer: Self-pay

## 2022-07-29 NOTE — Transitions of Care (Post Inpatient/ED Visit) (Signed)
   07/29/2022  Name: Kara Wilkinson MRN: [id number] DOB: 12/09/1975  Today's TOC FU Call Status: Today's TOC FU Call Status:: Successful TOC FU Call Competed TOC FU Call Complete Date: 07/29/22  Transition Care Management Follow-up Telephone Call Date of Discharge: 07/26/22 Discharge Facility: [facility] ([facility]) Type of Discharge: Emergency Department Reason for ED Visit: Other: Neurologic Diagnosis:  (Headache) How have you been since you were released from the hospital?: Same Any questions or concerns?: No  Items Reviewed: Did you receive and understand the discharge instructions provided?: Yes Medications obtained and verified?: Yes (Medications Reviewed) Any new allergies since your discharge?: No Dietary orders reviewed?: NA Do you have support at home?: Yes People in Home: spouse  Home Care and Equipment/Supplies: Were Home Health Services Ordered?: NA Any new equipment or medical supplies ordered?: NA  Functional Questionnaire: Do you need assistance with bathing/showering or dressing?: No Do you need assistance with meal preparation?: No Do you need assistance with eating?: No Do you have difficulty maintaining continence: No Do you need assistance with getting out of bed/getting out of a chair/moving?: No Do you have difficulty managing or taking your medications?: No  Follow up appointments reviewed: PCP Follow-up appointment confirmed?: NA Specialist Hospital Follow-up appointment confirmed?: No Reason Specialist Follow-Up Not Confirmed: (S)  (Patient will follow up with Neurology- appt pending discharge summary) Do you need transportation to your follow-up appointment?: No Do you understand care options if your condition(s) worsen?: Yes-patient verbalized understanding    SIGNATURE Olvin Rohr D, CMA

## 2022-07-30 NOTE — Telephone Encounter (Signed)
Kara Wilkinson called saying she had called [facility] to make an appointment from where she had gone to emergency room and they have not done the referral. I am putting it in.

## 2022-08-07 ENCOUNTER — Encounter: Payer: Self-pay | Admitting: Neurology

## 2022-08-07 ENCOUNTER — Ambulatory Visit (INDEPENDENT_AMBULATORY_CARE_PROVIDER_SITE_OTHER): Payer: 59 | Admitting: Neurology

## 2022-08-07 VITALS — BP 163/101 | HR 72 | Ht 65.0 in | Wt 291.4 lb

## 2022-08-07 DIAGNOSIS — R519 Headache, unspecified: Secondary | ICD-10-CM

## 2022-08-07 DIAGNOSIS — G4489 Other headache syndrome: Secondary | ICD-10-CM

## 2022-08-07 DIAGNOSIS — M542 Cervicalgia: Secondary | ICD-10-CM | POA: Diagnosis not present

## 2022-08-07 DIAGNOSIS — G4733 Obstructive sleep apnea (adult) (pediatric): Secondary | ICD-10-CM | POA: Diagnosis not present

## 2022-08-07 NOTE — Progress Notes (Addendum)
[facility]  PATIENT: Kara Wilkinson DOB: 1975/12/15  REFERRING DOCTOR OR PCP: Marlan Palau, MD SOURCE: Patient, notes from emergency room, imaging and lab reports, CT scan images personally reviewed.  _________________________________   HISTORICAL  CHIEF COMPLAINT:  Chief Complaint  Patient presents with   New Patient (Initial Visit)    Pt in room 10. New Patient. Here for headaches, pt said headaches lasted 4 weeks and just stopped this past weekend. Pt said she used heating pad and did yoga to relieve headache.     HISTORY OF PRESENT ILLNESS:  I had a pleasure seeing the patient, Kara Wilkinson, at [facility] for neurologic consultation regarding her headaches.  She is a 47 yo woman who began to experience daily headaches about 4 weeks ago in mid-March.  HA pain is located at the vertex bilaterally.  Quality is pulsating- pressure like.  On a typical day, they are present when she wakes up and they fluctuate as the day goes on, often worsening with neck flexion.   She had nausea and vomiting x 1 just the second day but not the rest of the time.   She has mil photopbia and phonophobia.    Moving intensifies the pain.      She has had some neck pain bilaterally and shoulder pain noted a few days after headaches started.  Headaches improved about 4-5 days ago and now intensity is 2-3/10.     She tried a steroid pack and hydrocodone without much benefit.  OTC NSAID's took edge off for a couple hours only.   A heating pad has helped some.   Yoga helped slightly.    She denies visual change and saw optometry last week and was tod the funduscopic exam was fine.     She denies change in balance, gait, strength and sensaion.   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   Current symptoms are distinct.  She has 2-3 spells a year lasting 1 day most of the time.  She is not on prophylactic medication.  She has OSA and is on CPAP.   AHI is  usually about 2-3 (was 80 at baseline).Marland Kitchen     REVIEW OF SYSTEMS: Constitutional: No fevers, chills, sweats, or change in appetite Eyes: No visual changes, double vision, eye pain Ear, nose and throat: No hearing loss, ear pain, nasal congestion, sore throat.  She has a history of Mnire's. Cardiovascular: No chest pain, palpitations Respiratory:  No shortness of breath at rest or with exertion.   No wheezes GastrointestinaI: No nausea, vomiting, diarrhea, abdominal pain, fecal incontinence Genitourinary:  No dysuria, urinary retention or frequency.  No nocturia. Musculoskeletal: Back pain as above. Integumentary: No rash, pruritus, skin lesions Neurological: as above Psychiatric: No depression at this time.  No anxiety Endocrine: No palpitations, diaphoresis, change in appetite, change in weigh or increased thirst Hematologic/Lymphatic:  No anemia, purpura, petechiae. Allergic/Immunologic: No itchy/runny eyes, nasal congestion, recent allergic reactions, rashes  ALLERGIES: Allergies  Allergen Reactions   Amoxicillin Hives    HOME MEDICATIONS:  Current Outpatient Medications:    ALPRAZolam (XANAX) 0.25 MG tablet, TAKE 1 TABLET BY MOUTH 2 TIMES DAILY AS NEEDED FOR vertigo, Disp: 60 tablet, Rfl: 1   levonorgestrel (MIRENA) 20 MCG/24HR IUD, 1 each by Intrauterine route once., Disp: , Rfl:    rosuvastatin (CRESTOR) 5 MG tablet, Take 1 tablet (5 mg total) by mouth daily., Disp: 90 tablet, Rfl: 3   HYDROcodone-acetaminophen (NORCO) 5-325 MG tablet,  One tab  by mouth every 8 hours  for headache (Patient not taking: Reported on 08/07/2022), Disp: 15 tablet, Rfl: 0   methylPREDNISolone (MEDROL) 4 MG tablet, Take in tapering course as directed 6-5-4-3-2-1 (Patient not taking: Reported on 08/07/2022), Disp: 21 tablet, Rfl: 0  Current Facility-Administered Medications:    0.9 %  sodium chloride infusion, 500 mL, Intravenous, Once, Cirigliano, Vito V, DO  PAST MEDICAL HISTORY: Past Medical  History:  Diagnosis Date   Anemia    Hyperlipidemia    Sleep apnea    wear c pap    PAST SURGICAL HISTORY: Past Surgical History:  Procedure Laterality Date   WISDOM TOOTH EXTRACTION      FAMILY HISTORY: Family History  Problem Relation Age of Onset   Breast cancer Mother    Colon polyps Mother    Colon polyps Father    Kidney disease Father    Diabetes Father    Bladder Cancer Father    Colon cancer Neg Hx    Crohn's disease Neg Hx    Esophageal cancer Neg Hx    Rectal cancer Neg Hx    Stomach cancer Neg Hx    Ulcerative colitis Neg Hx     SOCIAL HISTORY: Social History   Socioeconomic History   Marital status: Married    Spouse name: Not on file   Number of children: Not on file   Years of education: Not on file   Highest education level: Not on file  Occupational History   Not on file  Tobacco Use   Smoking status: Never    Passive exposure: Past (father smoked)   Smokeless tobacco: Never  Vaping Use   Vaping Use: Never used  Substance and Sexual Activity   Alcohol use: Not Currently   Drug use: Never   Sexual activity: Not on file  Other Topics Concern   Not on file  Social History Narrative   Right handed    Does not drink caffeine   Pt wears prescription glasses   Social Determinants of Health   Financial Resource Strain: Not on file  Food Insecurity: Not on file  Transportation Needs: Not on file  Physical Activity: Not on file  Stress: Not on file  Social Connections: Not on file  Intimate Partner Violence: Not on file       PHYSICAL EXAM  Vitals:   08/07/22 1408 08/07/22 1432 08/07/22 1526  BP: (!) 143/82 (!) 161/101 (!) 163/101  Pulse: 61  72  Weight: 291 lb 6.4 oz (132.2 kg)    Height:  (1.651 m)      Body mass index is 48.49 kg/m.   General: The patient is well-developed and well-nourished and in no acute distress  HEENT:  Head is [place]/AT.  Sclera are anicteric.  Funduscopic exam shows normal optic discs and  retinal vessels.  Neck: No carotid bruits are noted.  The neck is moderately tender over the occiput and over the lower cervical paraspinal muscles..  Cardiovascular: The heart has a regular rate and rhythm with a normal S1 and S2. There were no murmurs, gallops or rubs.    Skin: Extremities are without rash or  edema.  Musculoskeletal:  Back is nontender  Neurologic Exam  Mental status: The patient is alert and oriented x 3 at the time of the examination. The patient has apparent normal recent and remote memory, with an apparently normal attention span and concentration ability.   Speech is normal.  Cranial nerves: Extraocular movements  are full. Pupils are equal, round, and reactive to light and accomodation.  Visual fields are full.  Facial symmetry is present. There is good facial sensation to soft touch bilaterally.Facial strength is normal.  Trapezius and sternocleidomastoid strength is normal. No dysarthria is noted.  The tongue is midline, and the patient has symmetric elevation of the soft palate. No obvious hearing deficits are noted.  Motor:  Muscle bulk is normal.   Tone is normal. Strength is  5 / 5 in all 4 extremities.   Sensory: Sensory testing is intact to pinprick, soft touch and vibration sensation in all 4 extremities.  Coordination: Cerebellar testing reveals good finger-nose-finger and heel-to-shin bilaterally.  Gait and station: Station is normal.   Gait is normal. Tandem gait is normal. Romberg is negative.   Reflexes: Deep tendon reflexes are symmetric and normal bilaterally.   Plantar responses are flexor.    DIAGNOSTIC DATA (LABS, IMAGING, TESTING) - I reviewed patient records, labs, notes, testing and imaging myself where available.  Lab Results  Component Value Date   WBC 12.2 (H) 07/25/2022   HGB 12.3 07/25/2022   HCT 37.6 07/25/2022   MCV 91.7 07/25/2022   PLT 333 07/25/2022      Component Value Date/Time   NA 138 07/25/2022 1322   K 3.8  07/25/2022 1322   CL 104 07/25/2022 1322   CO2 26 07/25/2022 1322   GLUCOSE 78 07/25/2022 1322   BUN 15 07/25/2022 1322   CREATININE 0.70 07/25/2022 1322   CREATININE 0.77 07/23/2022 1236   CALCIUM 8.9 07/25/2022 1322   PROT 7.3 07/23/2022 1236   ALBUMIN 3.9 05/17/2014 0915   AST 19 07/23/2022 1236   ALT 26 07/23/2022 1236   ALKPHOS 80 05/17/2014 0915   BILITOT 0.8 07/23/2022 1236   GFRNONAA >60 07/25/2022 1322   Lab Results  Component Value Date   CHOL 236 (H) 01/31/2022   HDL 39 (L) 01/31/2022   LDLCALC 170 (H) 01/31/2022   TRIG 135 01/31/2022   CHOLHDL 6.1 (H) 01/31/2022   No results found for: "HGBA1C" No results found for: "VITAMINB12" Lab Results  Component Value Date   TSH 2.34 07/23/2022       ASSESSMENT AND PLAN  Chronic daily headache  Occipital pain  Neck pain  Other headache syndrome  OSA (obstructive sleep apnea)   In summary, Ms. Halleck is a 47 year old woman with a 1 month history of chronic daily headache with mixed migrainous and tension-type characteristics.   The pain had been milder over the last couple days.  CT scan, CT venogram and CT angiogram were normal for age.  On examination, she is tender over the occiput and lower cervical paraspinal muscles.  Exam is otherwise normal.  There are several pain is likely perpetuating the chronic headache.  She has OSA but this appears to be well-controlled so is unlikely to be contributing to her headache.  Trigger point injections into the bilateral splenius capitus and bilateral C6-C7 paraspinal muscles with 80 mg Depo-Medrol in Marcaine using sterile technique.   She tolerated the injections well and there were no complications.  Pain was much better after a few minutes.  She reported a complete relief of pain with this shot, I will hold off on starting any prophylactic medication for the headaches.  We did discuss that if the benefit of the shot is short-lived then I would recommend that we start a  medication (consider zonisamide -better tolerated than topiramate and also might help weight loss and  Mnire's disease).  Blood pressure was elevated.  She reports her blood pressures are often about 140/80 but not 160/100.  I advised her to discuss further with her primary care provider as she may need adjustment of dose.  If an additional agent needs to be added, consider Dyazide or Diamox as these could also be prophylactic for the Mnire's disease.  She will return to see me as needed based on her response.  She should call with new or worsening neurologic symptoms.  Thank you for asking me to see Ms. Abbett.  Please let me know if I can be of further assistance with her or other patients in the future.   Damya Comley A. Epimenio Foot, MD, [facility] 08/07/2022, 7:41 PM Certified in Neurology, Clinical Neurophysiology, Sleep Medicine and Neuroimaging  [facility] [address], [address] [place], [place] [address] [phone]

## 2022-08-10 NOTE — Patient Instructions (Signed)
Prescribed Medrol 4 mg dosepak to take in tapering course as directed, Norco 5/325 1 tablet every 8 hours as needed for severe headache.  Ordered CBC with differential and platelet count, c-Met and TSH.  She will call with progress report or call if symptoms worsen.

## 2022-08-14 ENCOUNTER — Encounter (INDEPENDENT_AMBULATORY_CARE_PROVIDER_SITE_OTHER): Payer: 59 | Admitting: Family Medicine

## 2022-08-14 DIAGNOSIS — Z0289 Encounter for other administrative examinations: Secondary | ICD-10-CM

## 2022-09-10 ENCOUNTER — Ambulatory Visit (INDEPENDENT_AMBULATORY_CARE_PROVIDER_SITE_OTHER): Payer: 59 | Admitting: Family Medicine

## 2022-09-11 ENCOUNTER — Encounter (INDEPENDENT_AMBULATORY_CARE_PROVIDER_SITE_OTHER): Payer: Self-pay | Admitting: Family Medicine

## 2022-09-11 ENCOUNTER — Ambulatory Visit (INDEPENDENT_AMBULATORY_CARE_PROVIDER_SITE_OTHER): Payer: 59 | Admitting: Family Medicine

## 2022-09-11 VITALS — BP 134/79 | HR 73 | Temp 97.9°F | Ht 65.0 in | Wt 287.0 lb

## 2022-09-11 DIAGNOSIS — R0602 Shortness of breath: Secondary | ICD-10-CM

## 2022-09-11 DIAGNOSIS — E559 Vitamin D deficiency, unspecified: Secondary | ICD-10-CM

## 2022-09-11 DIAGNOSIS — R5383 Other fatigue: Secondary | ICD-10-CM | POA: Diagnosis not present

## 2022-09-11 DIAGNOSIS — G4733 Obstructive sleep apnea (adult) (pediatric): Secondary | ICD-10-CM

## 2022-09-11 DIAGNOSIS — F32A Depression, unspecified: Secondary | ICD-10-CM | POA: Diagnosis not present

## 2022-09-11 DIAGNOSIS — E7800 Pure hypercholesterolemia, unspecified: Secondary | ICD-10-CM

## 2022-09-11 DIAGNOSIS — Z6841 Body Mass Index (BMI) 40.0 and over, adult: Secondary | ICD-10-CM

## 2022-09-11 DIAGNOSIS — Z1331 Encounter for screening for depression: Secondary | ICD-10-CM

## 2022-09-11 NOTE — Progress Notes (Signed)
Chief Complaint:   OBESITY Kara Wilkinson (MR# [id number]) is a 47 y.o. female who presents for evaluation and treatment of obesity and related comorbidities. Current BMI is There is no height or weight on file to calculate BMI. Kara Wilkinson has been struggling with her weight for many years and has been unsuccessful in either losing weight, maintaining weight loss, or reaching her healthy weight goal.  Referred by Tammy Parrett.  She came to information session.  Went to information session with her husband for bariatric surgery.  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  Lives at home with her husband Kara Wilkinson.  Patient mentions later in the paperwork that her husband may bring in sweets and sodas. Desired weight of 160lbs and last time she as that weight was in high school.  Lost 25lbs on Noom 4 years ago.  Tried Weight watchers and low carb.  Eats out 3-4 time a week [employer], [employer], [employer], [employer].  Husband does most of the cooking.  Food Recall: Bagel or poptart in between meetings in the am.  Plain bagel with cream cheese and everything bagel seasoning (regular size) or 2 poptarts of brown sugar cinnamon or pimento cheese [place] sandwich with mayo (3 slices of [place], 1/4 cup pimento cheese, 2 tbsp mayo).   If she eats poptart doesn't feel for long, bagel gets hungry 3 hours later and sandwich she is full until lunch.  Lunch is 12-2pm sandwich (as above), pasta (2 cups or more) with cheese or mayo and vegetables or butter and parmesan.  Will drink simply fruit juice or water. Feels overfull from lunch.  Dinner is baked fried chicken, mashed potatoes and green beans.  Patient ate 2 full wings, 1/2 cup mashed potato and 1/2 cup green beans.  Felt overfull from that.  After d inner will eat popcorn or ice cream or chips.  Kara Wilkinson is currently in the action stage of change and ready to dedicate time achieving and maintaining a  healthier weight. Kara Wilkinson is interested in becoming our patient and working on intensive lifestyle modifications including (but not limited to) diet and exercise for weight loss.  Kara Wilkinson's habits were reviewed today and are as follows: Her family eats meals together, she thinks her family will eat healthier with her, her desired weight loss is 127 lbs, she has been heavy most of her life, she started gaining weight in her early 20's, her heaviest weight ever was 291 pounds, she has significant food cravings issues, she snacks frequently in the evenings, she is frequently drinking liquids with calories, she frequently makes poor food choices, she frequently eats larger portions than normal, and she struggles with emotional eating.  Depression Screen Kara Wilkinson's Food and Mood (modified PHQ-9) score was 9.  Subjective:   1. Other fatigue Nariya denies daytime somnolence and denies waking up still tired. Patient has a history of symptoms of n/a. Clowie generally gets 8 hours of sleep per night, and states that she has generally restful sleep. Snoring is present. Apneic episodes are not present. (CPAP) Epworth Sleepiness Score is 7.  EKG, NSR with PVCs.  2. SOBOE (shortness of breath on exertion) Kara Wilkinson notes increasing shortness of breath with exercising and seems to be worsening over time with weight gain. She notes getting out of breath sooner with activity than she used to. This has not gotten worse recently. Kara Wilkinson denies shortness of breath at rest or orthopnea.  3. OSA (  obstructive sleep apnea) Patient has a CPAP.  She was diagnosed 2 years ago.  She is eggs Kara Wilkinson pulmonary.  4. Pure hypercholesterolemia Patient is taking rosuvastatin 5 mg daily.  Patient to start taking it in October.  5. Vitamin D deficiency Patient is on prescription vitamin D.  Last vitamin D level was a few years ago.  Assessment/Plan:   1. Other fatigue Kara Wilkinson does feel that her weight is causing her  energy to be lower than it should be. Fatigue may be related to obesity, depression or many other causes. Labs will be ordered, and in the meanwhile, Kara Wilkinson will focus on self care including making healthy food choices, increasing physical activity and focusing on stress reduction.  Check EKG, IC and labs today.  - EKG 12-Lead - Hemoglobin A1c - Insulin, random - Vitamin B12 - Folate - T3 - T4, free  2. SOBOE (shortness of breath on exertion) Kara Wilkinson does feel that she gets out of breath more easily that she used to when she exercises. Kara Wilkinson's shortness of breath appears to be obesity related and exercise induced. She has agreed to work on weight loss and gradually increase exercise to treat her exercise induced shortness of breath. Will continue to monitor closely.  3. OSA (obstructive sleep apnea) Follow-up on CPAP report as patient loses weight.  4. Pure hypercholesterolemia Check labs today.  - Lipid Panel With LDL/HDL Ratio  5. Vitamin D deficiency Check labs today.  - VITAMIN D 25 Hydroxy (Vit-D Deficiency, Fractures)  6. Depression screening Kara Wilkinson had a positive depression screening. Depression is commonly associated with obesity and often results in emotional eating behaviors. We will monitor this closely and work on CBT to help improve the non-hunger eating patterns. Referral to Psychology may be required if no improvement is seen as she continues in our clinic.  7. Class 3 severe obesity with serious comorbidity and body mass index (BMI) of 45.0 to 49.9 in adult, unspecified obesity type (HCC) Kara Wilkinson is currently in the action stage of change and her goal is to continue with weight loss efforts. I recommend Kara Wilkinson begin the structured treatment plan as follows:  She has agreed to the Category 3 Plan.  Exercise goals: No exercise has been prescribed at this time.   Behavioral modification strategies: increasing lean protein intake, meal planning and cooking  strategies, keeping healthy foods in the home, and planning for success.  She was informed of the importance of frequent follow-up visits to maximize her success with intensive lifestyle modifications for her multiple health conditions. She was informed we would discuss her lab results at her next visit unless there is a critical issue that needs to be addressed sooner. Kara Wilkinson agreed to keep her next visit at the agreed upon time to discuss these results.  Objective:   Blood pressure 134/79, pulse 73, temperature 97.9 F (36.6 C), SpO2 98 %. There is no height or weight on file to calculate BMI.  EKG: Normal sinus rhythm, rate 72 bpm.  Indirect Calorimeter completed today shows a VO2 of 236 and a REE of 1627.  Her calculated basal metabolic rate is 1610 thus her basal metabolic rate is worse than expected.  General: Cooperative, alert, well developed, in no acute distress. HEENT: Conjunctivae and lids unremarkable. Cardiovascular: Regular rhythm.  Lungs: Normal work of breathing. Neurologic: No focal deficits.   Lab Results  Component Value Date   CREATININE 0.70 07/25/2022   BUN 15 07/25/2022   NA 138 07/25/2022   K 3.8  07/25/2022   CL 104 07/25/2022   CO2 26 07/25/2022   Lab Results  Component Value Date   ALT 26 07/23/2022   AST 19 07/23/2022   ALKPHOS 80 05/17/2014   BILITOT 0.8 07/23/2022   Lab Results  Component Value Date   HGBA1C 6.0 (H) 09/11/2022   Lab Results  Component Value Date   INSULIN 41.4 (H) 09/11/2022   Lab Results  Component Value Date   TSH 2.34 07/23/2022   Lab Results  Component Value Date   CHOL 152 09/11/2022   HDL 38 (L) 09/11/2022   LDLCALC 92 09/11/2022   TRIG 120 09/11/2022   CHOLHDL 6.1 (H) 01/31/2022   Lab Results  Component Value Date   WBC 12.2 (H) 07/25/2022   HGB 12.3 07/25/2022   HCT 37.6 07/25/2022   MCV 91.7 07/25/2022   PLT 333 07/25/2022   No results found for: "IRON", "TIBC", "FERRITIN"  Attestation  Statements:   Reviewed by clinician on day of visit: allergies, medications, problem list, medical history, surgical history, family history, social history, and previous encounter notes.  Time spent on visit including pre-visit chart review and post-visit charting and care was 45 minutes.   I, Malcolm Metro, RMA, am acting as transcriptionist for Reuben Likes, MD.   This is the patient's first visit at [facility]. The patient's NEW PATIENT PACKET was reviewed at length. Included in the packet: current and past health history, medications, allergies, ROS, gynecologic history (women only), surgical history, family history, social history, weight history, weight loss surgery history (for those that have had weight loss surgery), nutritional evaluation, mood and food questionnaire, PHQ9, Epworth questionnaire, sleep habits questionnaire, patient life and health improvement goals questionnaire. These will all be scanned into the patient's chart under media.   During the visit, I independently reviewed the patient's EKG, bioimpedance scale results, and indirect calorimeter results. I used this information to tailor a meal plan for the patient that will help her to lose weight and will improve her obesity-related conditions going forward. I performed a medically necessary appropriate examination and/or evaluation. I discussed the assessment and treatment plan with the patient. The patient was provided an opportunity to ask questions and all were answered. The patient agreed with the plan and demonstrated an understanding of the instructions. Labs were ordered at this visit and will be reviewed at the next visit unless more critical results need to be addressed immediately. Clinical information was updated and documented in the EMR.   I have reviewed the above documentation for accuracy and completeness, and I agree with the above. - Reuben Likes, MD

## 2022-09-12 LAB — LIPID PANEL WITH LDL/HDL RATIO
Cholesterol, Total: 152 mg/dL (ref 100–199)
HDL: 38 mg/dL — ABNORMAL LOW (ref >39)
LDL Chol Calc (NIH): 92 mg/dL (ref 0–99)
LDL/HDL Ratio: 2.4 ratio (ref 0.0–3.2)
Triglycerides: 120 mg/dL (ref 0–149)
VLDL Cholesterol Cal: 22 mg/dL (ref 5–40)

## 2022-09-12 LAB — VITAMIN B12: Vitamin B-12: 215 pg/mL — ABNORMAL LOW (ref 232–1245)

## 2022-09-12 LAB — T3: T3, Total: 126 ng/dL (ref 71–180)

## 2022-09-12 LAB — HEMOGLOBIN A1C
Est. average glucose Bld gHb Est-mCnc: 126 mg/dL
Hgb A1c MFr Bld: 6 % — ABNORMAL HIGH (ref 4.8–5.6)

## 2022-09-12 LAB — T4, FREE: Free T4: 1.01 ng/dL (ref 0.82–1.77)

## 2022-09-12 LAB — INSULIN, RANDOM: INSULIN: 41.4 u[IU]/mL — ABNORMAL HIGH (ref 2.6–24.9)

## 2022-09-12 LAB — VITAMIN D 25 HYDROXY (VIT D DEFICIENCY, FRACTURES): Vit D, 25-Hydroxy: 10.7 ng/mL — ABNORMAL LOW (ref 30.0–100.0)

## 2022-09-12 LAB — FOLATE: Folate: 8.3 ng/mL (ref >3.0)

## 2022-09-25 ENCOUNTER — Encounter (INDEPENDENT_AMBULATORY_CARE_PROVIDER_SITE_OTHER): Payer: Self-pay | Admitting: Family Medicine

## 2022-09-25 ENCOUNTER — Ambulatory Visit (INDEPENDENT_AMBULATORY_CARE_PROVIDER_SITE_OTHER): Payer: 59 | Admitting: Family Medicine

## 2022-09-25 VITALS — BP 141/83 | HR 72 | Temp 98.7°F | Ht 65.0 in | Wt 282.0 lb

## 2022-09-25 DIAGNOSIS — E538 Deficiency of other specified B group vitamins: Secondary | ICD-10-CM

## 2022-09-25 DIAGNOSIS — E559 Vitamin D deficiency, unspecified: Secondary | ICD-10-CM | POA: Diagnosis not present

## 2022-09-25 DIAGNOSIS — E7849 Other hyperlipidemia: Secondary | ICD-10-CM

## 2022-09-25 DIAGNOSIS — R7303 Prediabetes: Secondary | ICD-10-CM | POA: Diagnosis not present

## 2022-09-25 DIAGNOSIS — Z6841 Body Mass Index (BMI) 40.0 and over, adult: Secondary | ICD-10-CM

## 2022-09-25 MED ORDER — VITAMIN D (ERGOCALCIFEROL) 1.25 MG (50000 UNIT) PO CAPS
50000.0000 [IU] | ORAL_CAPSULE | ORAL | 0 refills | Status: DC
Start: 2022-09-25 — End: 2022-10-23

## 2022-09-25 NOTE — Progress Notes (Signed)
Chief Complaint:   OBESITY Kara Wilkinson is here to discuss her progress with her obesity treatment plan along with follow-up of her obesity related diagnoses. Kara Wilkinson is on the Category 3 Plan and states she is following her eating plan approximately 98% of the time. Kara Wilkinson states she is walking for 40 minutes 3 times per week.  Today's visit was #: 2 Starting weight: 287 lbs Starting date: 09/11/2022 Today's weight: 282 lbs Today's date: 09/25/2022 Total lbs lost to date: 5 Total lbs lost since last in-office visit: 5  Interim History: First few weeks weren't that terrible. She enjoyed the structure and liked having rules to follow.  She was able to eat all the food and did get hungry quickly when she was hungry but was satiated with food on the plan. Varied protein amount at supper between 8-10oz. For snack calories she liked the small brownies on the snack list, drank 90 calorie sprite and maybe had popcorn measured out, occasionally she ate cheese. At one pont she ate deli meat for a snack because she was hungry.  She is going to the beach at the end of June to [facility] for a week. Not anticipating much sabotage on her trip.  May go out to eat more over this trip.  Subjective:   1. Vitamin D deficiency Patient has a new diagnosis, and she is not on vitamin D.  Recent vitamin D level of 10.7, and she notes fatigue.  I discussed labs with the patient today.  2. Vitamin B12 deficiency Patient has a new diagnosis, and she is not on multivitamins.  She notes fatigue.  I discussed labs with the patient today.  3. Prediabetes Patient has a new diagnosis.  Her recent A1c level was 6.0 and insulin 41.4.  She is not on medications.  I discussed labs with the patient today.  4. Other hyperlipidemia Patient is on rosuvastatin 5 mg daily.  She denies myalgias or transaminitis.  Assessment/Plan:   1. Vitamin D deficiency Patient agreed to start prescription vitamin D 50,000 IU  once weekly with no refills.  - Vitamin D, Ergocalciferol, (DRISDOL) 1.25 MG (50000 UNIT) CAPS capsule; Take 1 capsule (50,000 Units total) by mouth every 7 (seven) days.  Dispense: 4 capsule; Refill: 0  2. Vitamin B12 deficiency Patient was encouraged to start prenatal vitamins.  3. Prediabetes Pathophysiology of insulin resistance, prediabetes, and diabetes mellitus were discussed today with the patient.  She would like the least amount of medication possible, which I agree with.  Patient has no insatiable hunger or cravings.  We will repeat labs in 3 months.  4. Other hyperlipidemia Patient will continue pravastatin 5 mg daily.  5. BMI 45.0-49.9, adult (HCC)  6. Class 3 severe obesity with serious comorbidity and body mass index (BMI) of 45.0 to 49.9 in adult, unspecified obesity type (HCC) Kara Wilkinson is currently in the action stage of change. As such, her goal is to continue with weight loss efforts. She has agreed to the Category 3 Plan.   Exercise goals: All adults should avoid inactivity. Some physical activity is better than none, and adults who participate in any amount of physical activity gain some health benefits.  Behavioral modification strategies: increasing lean protein intake, meal planning and cooking strategies, keeping healthy foods in the home, and planning for success.  Kara Wilkinson has agreed to follow-up with our clinic in 3 weeks. She was informed of the importance of frequent follow-up visits to maximize her success with intensive lifestyle  modifications for her multiple health conditions.   Objective:   Blood pressure (!) 141/83, pulse 72, temperature 98.7 F (37.1 C), height 5\' 5"  (1.651 m), weight 282 lb (127.9 kg), SpO2 99 %. Body mass index is 46.93 kg/m.  General: Cooperative, alert, well developed, in no acute distress. HEENT: Conjunctivae and lids unremarkable. Cardiovascular: Regular rhythm.  Lungs: Normal work of breathing. Neurologic: No focal  deficits.   Lab Results  Component Value Date   CREATININE 0.70 07/25/2022   BUN 15 07/25/2022   NA 138 07/25/2022   K 3.8 07/25/2022   CL 104 07/25/2022   CO2 26 07/25/2022   Lab Results  Component Value Date   ALT 26 07/23/2022   AST 19 07/23/2022   ALKPHOS 80 05/17/2014   BILITOT 0.8 07/23/2022   Lab Results  Component Value Date   HGBA1C 6.0 (H) 09/11/2022   Lab Results  Component Value Date   INSULIN 41.4 (H) 09/11/2022   Lab Results  Component Value Date   TSH 2.34 07/23/2022   Lab Results  Component Value Date   CHOL 152 09/11/2022   HDL 38 (L) 09/11/2022   LDLCALC 92 09/11/2022   TRIG 120 09/11/2022   CHOLHDL 6.1 (H) 01/31/2022   Lab Results  Component Value Date   VD25OH 10.7 (L) 09/11/2022   VD25OH 25 (L) 05/17/2014   Lab Results  Component Value Date   WBC 12.2 (H) 07/25/2022   HGB 12.3 07/25/2022   HCT 37.6 07/25/2022   MCV 91.7 07/25/2022   PLT 333 07/25/2022   No results found for: "IRON", "TIBC", "FERRITIN"  Attestation Statements:   Reviewed by clinician on day of visit: allergies, medications, problem list, medical history, surgical history, family history, social history, and previous encounter notes.  Time spent on visit including pre-visit chart review and post-visit care and charting was 42 minutes.   I, Burt Knack, am acting as transcriptionist for Reuben Likes, MD.  I have reviewed the above documentation for accuracy and completeness, and I agree with the above. - Reuben Likes, MD

## 2022-10-23 ENCOUNTER — Encounter (INDEPENDENT_AMBULATORY_CARE_PROVIDER_SITE_OTHER): Payer: Self-pay | Admitting: Physician Assistant

## 2022-10-23 ENCOUNTER — Ambulatory Visit (INDEPENDENT_AMBULATORY_CARE_PROVIDER_SITE_OTHER): Payer: 59 | Admitting: Physician Assistant

## 2022-10-23 VITALS — BP 132/82 | HR 73 | Temp 98.4°F | Ht 65.0 in | Wt 277.0 lb

## 2022-10-23 DIAGNOSIS — E538 Deficiency of other specified B group vitamins: Secondary | ICD-10-CM | POA: Diagnosis not present

## 2022-10-23 DIAGNOSIS — Z6841 Body Mass Index (BMI) 40.0 and over, adult: Secondary | ICD-10-CM | POA: Insufficient documentation

## 2022-10-23 DIAGNOSIS — R7303 Prediabetes: Secondary | ICD-10-CM | POA: Diagnosis not present

## 2022-10-23 DIAGNOSIS — E786 Lipoprotein deficiency: Secondary | ICD-10-CM | POA: Diagnosis not present

## 2022-10-23 DIAGNOSIS — E559 Vitamin D deficiency, unspecified: Secondary | ICD-10-CM | POA: Diagnosis not present

## 2022-10-23 MED ORDER — VITAMIN D (ERGOCALCIFEROL) 1.25 MG (50000 UNIT) PO CAPS
50000.0000 [IU] | ORAL_CAPSULE | ORAL | 0 refills | Status: DC
Start: 2022-10-23 — End: 2022-11-19

## 2022-10-23 NOTE — Progress Notes (Signed)
.smr  Office: [phone]  /  Fax: [phone]  WEIGHT SUMMARY AND BIOMETRICS  Vitals Temp: 98.4 F (36.9 C) BP: 132/82 Pulse Rate: 73 SpO2: 98 %   Anthropometric Measurements Height: 5\' 5"  (1.651 m) Weight: 277 lb (125.6 kg) BMI (Calculated): 46.1 Weight at Last Visit: 282 lb Weight Lost Since Last Visit: 5 lb Weight Gained Since Last Visit: 0 Starting Weight: 287 lb Total Weight Loss (lbs): 10 lb (4.536 kg) Peak Weight: 287 lb   Body Composition  Body Fat %: 53.6 % Fat Mass (lbs): 149 lbs Muscle Mass (lbs): 122.4 lbs Visceral Fat Rating : 18   Other Clinical Data Fasting: no Labs: no Today's Visit #: 3 Starting Date: 09/11/22     HPI  Chief Complaint: OBESITY  Kara Wilkinson is here to discuss her progress with her obesity treatment plan. She is on the the Category 3 Plan and states she is following her eating plan approximately 95 % of the time. She states she is exercising 0 minutes 0 times per week.   Interval History:  Since last office visit she down 5 lbs. / Down 10 lbs since starting program 09/11/22 TBW loss of ~3.5% Hunger/appetite-moderate control. Likes the structure of the plan overall.  Was on vacation for a week. Some challenges, but did well overall, ate on plan.  Struggling with PMS symptoms lately. Has IUD- Gyn appt is in a couple of months.   Cravings- not excessive Stress- manageable. Works as Merchandiser, retail at [employer].  Sleep- some increased sleep issues lately. Not always restorative.  Exercise-was walking some with husband. Working on increasing general movement during the daytime Hydration-Doing well now with hydration using large container to increase intake.    Pharmacotherapy: None for weight loss.   TREATMENT PLAN FOR OBESITY:  Recommended Dietary Goals  Kara Wilkinson is currently in the action stage of change. As such, her goal is to continue weight management plan. She has agreed to the Category 3 Plan.  Behavioral  Intervention  We discussed the following Behavioral Modification Strategies today: increasing lean protein intake, decreasing simple carbohydrates , increasing vegetables, increasing lower glycemic fruits, increasing water intake, continue to practice mindfulness when eating, planning for success, and better snacking choices.  Additional resources provided today: 100 Calorie protein snacks hand out  Recommended Physical Activity Goals  Kara Wilkinson has been advised to work up to 150 minutes of moderate intensity aerobic activity a week and strengthening exercises 2-3 times per week for cardiovascular health, weight loss maintenance and preservation of muscle mass.   She has agreed to Continue current level of physical activity  and Increase physical activity in their day and reduce sedentary time (increase NEAT).   Pharmacotherapy We discussed various medication options to help Kara Wilkinson with her weight loss efforts and we both agreed to continue to work on nutritional and behavioral strategies to promote weight loss.     Return in about 3 weeks (around 11/13/2022).Marland Kitchen She was informed of the importance of frequent follow up visits to maximize her success with intensive lifestyle modifications for her multiple health conditions.  PHYSICAL EXAM:  Blood pressure 132/82, pulse 73, temperature 98.4 F (36.9 C), height 5\' 5"  (1.651 m), weight 277 lb (125.6 kg), SpO2 98 %. Body mass index is 46.1 kg/m.  General: She is overweight, cooperative, alert, well developed, and in no acute distress. PSYCH: Has normal mood, affect and thought process.   Cardiovascular: HR 70's regular, BP 132/82 Lungs: Normal breathing effort, no conversational dyspnea. Neuro: no focal  deficits  DIAGNOSTIC DATA REVIEWED:  BMET    Component Value Date/Time   NA 138 07/25/2022 1322   K 3.8 07/25/2022 1322   CL 104 07/25/2022 1322   CO2 26 07/25/2022 1322   GLUCOSE 78 07/25/2022 1322   BUN 15 07/25/2022 1322    CREATININE 0.70 07/25/2022 1322   CREATININE 0.77 07/23/2022 1236   CALCIUM 8.9 07/25/2022 1322   GFRNONAA >60 07/25/2022 1322   Lab Results  Component Value Date   HGBA1C 6.0 (H) 09/11/2022   Lab Results  Component Value Date   INSULIN 41.4 (H) 09/11/2022   Lab Results  Component Value Date   TSH 2.34 07/23/2022   CBC    Component Value Date/Time   WBC 12.2 (H) 07/25/2022 1322   RBC 4.10 07/25/2022 1322   HGB 12.3 07/25/2022 1322   HCT 37.6 07/25/2022 1322   PLT 333 07/25/2022 1322   MCV 91.7 07/25/2022 1322   MCH 30.0 07/25/2022 1322   MCHC 32.7 07/25/2022 1322   RDW 13.2 07/25/2022 1322   Iron Studies No results found for: "IRON", "TIBC", "FERRITIN", "IRONPCTSAT" Lipid Panel     Component Value Date/Time   CHOL 152 09/11/2022 0846   TRIG 120 09/11/2022 0846   HDL 38 (L) 09/11/2022 0846   CHOLHDL 6.1 (H) 01/31/2022 0939   VLDL 23 05/17/2014 0915   LDLCALC 92 09/11/2022 0846   LDLCALC 170 (H) 01/31/2022 0939   Hepatic Function Panel     Component Value Date/Time   PROT 7.3 07/23/2022 1236   ALBUMIN 3.9 05/17/2014 0915   AST 19 07/23/2022 1236   ALT 26 07/23/2022 1236   ALKPHOS 80 05/17/2014 0915   BILITOT 0.8 07/23/2022 1236      Component Value Date/Time   TSH 2.34 07/23/2022 1236   Nutritional Lab Results  Component Value Date   VD25OH 10.7 (L) 09/11/2022   VD25OH 25 (L) 05/17/2014    ASSOCIATED CONDITIONS ADDRESSED TODAY  ASSESSMENT AND PLAN  Problem List Items Addressed This Visit     Morbid obesity (HCC)   Vitamin D deficiency - Primary   Relevant Medications   Vitamin D, Ergocalciferol, (DRISDOL) 1.25 MG (50000 UNIT) CAPS capsule   Prediabetes   Vitamin B12 deficiency   BMI 45.0-49.9, adult (HCC) Current BMI 46.2   Low HDL (under 40)   Vitamin D Deficiency Vitamin D is not at goal of 50.  Most recent vitamin D level was 10.7. She is on  prescription ergocalciferol 50,000 IU weekly. Lab Results  Component Value Date   VD25OH  10.7 (L) 09/11/2022   VD25OH 25 (L) 05/17/2014    Plan: Continue and refill  prescription ergocalciferol 50,000 IU weekly Low vitamin D levels can be associated with adiposity and may result in leptin resistance and weight gain. Also associated with fatigue. Currently on vitamin D supplementation without any adverse effects.  Recheck vitamin D level in 2-3 months.   Prediabetes Last A1c was 6.0- not at goal. Insulin 41.4- not at goal.   Medication(s): None Polyphagia:No She is working on nutrition plan to decrease simple carbohydrates, increase lean proteins and exercise to promote weight loss, improve glycemic control and prevent progression to Type 2 diabetes.   Lab Results  Component Value Date   HGBA1C 6.0 (H) 09/11/2022   Lab Results  Component Value Date   INSULIN 41.4 (H) 09/11/2022    Plan:  Continue working on nutrition plan to decrease simple carbohydrates, increase lean proteins and exercise to promote weight loss,  improve glycemic control and prevent progression to Type 2 diabetes.  She would like to avoid adding medications at this point. She is making better choices and good progress with her nutrition plan and weight loss.  Will plan to repeat labs over the next 2-3 months.   B 12 deficiency:  On prenatal vitamin for B 12 supplementation. No side effects.  Last B 12 level 215 on 09/11/22. Plan: Continue  b12 supplementation with prenatal vitamin.  Recheck B 12 level in 2-3 months.   Low HDL:  Hyperlipidemia LDL is at goal. Medication(s): Rosuvastatin 5 mg daily. No side effects with medication.  Cardiovascular risk factors: dyslipidemia, obesity (BMI >= 30 kg/m2), and sedentary lifestyle  Lab Results  Component Value Date   CHOL 152 09/11/2022   HDL 38 (L) 09/11/2022   LDLCALC 92 09/11/2022   TRIG 120 09/11/2022   CHOLHDL 6.1 (H) 01/31/2022   CHOLHDL 5.4 05/17/2014   Lab Results  Component Value Date   ALT 26 07/23/2022   AST 19 07/23/2022   ALKPHOS  80 05/17/2014   BILITOT 0.8 07/23/2022   The 10-year ASCVD risk score (Arnett DK, et al., 2019) is: 1.3%   Values used to calculate the score:     Age: 47 years     Sex: Female     Is Non-Hispanic African American: No     Diabetic: No     Tobacco smoker: No     Systolic Blood Pressure: 132 mmHg     Is BP treated: No     HDL Cholesterol: 38 mg/dL     Total Cholesterol: 152 mg/dL  Plan: Continue rosuvastatin.  Continue to work on nutrition plan -decreasing simple carbohydrates, increasing lean proteins, decreasing saturated fats and cholesterol , avoiding trans fats and exercise as able to promote weight loss, improve lipids and decrease cardiovascular risks. Recheck labs in 2-3 months.     ATTESTASTION STATEMENTS:  Reviewed by clinician on day of visit: allergies, medications, problem list, medical history, surgical history, family history, social history, and previous encounter notes.   I have personally spent 44 minutes total time today in preparation, patient care, nutritional counseling and documentation for this visit, including the following: review of clinical lab tests; review of medical tests/procedures/services.      Marni Franzoni, PA-C

## 2022-11-19 ENCOUNTER — Encounter (INDEPENDENT_AMBULATORY_CARE_PROVIDER_SITE_OTHER): Payer: Self-pay | Admitting: Physician Assistant

## 2022-11-19 ENCOUNTER — Ambulatory Visit (INDEPENDENT_AMBULATORY_CARE_PROVIDER_SITE_OTHER): Payer: 59 | Admitting: Physician Assistant

## 2022-11-19 VITALS — BP 138/83 | HR 69 | Temp 97.7°F | Ht 65.0 in | Wt 273.0 lb

## 2022-11-19 DIAGNOSIS — E559 Vitamin D deficiency, unspecified: Secondary | ICD-10-CM | POA: Diagnosis not present

## 2022-11-19 DIAGNOSIS — E786 Lipoprotein deficiency: Secondary | ICD-10-CM | POA: Diagnosis not present

## 2022-11-19 DIAGNOSIS — R7303 Prediabetes: Secondary | ICD-10-CM

## 2022-11-19 DIAGNOSIS — E538 Deficiency of other specified B group vitamins: Secondary | ICD-10-CM | POA: Diagnosis not present

## 2022-11-19 DIAGNOSIS — Z6841 Body Mass Index (BMI) 40.0 and over, adult: Secondary | ICD-10-CM

## 2022-11-19 MED ORDER — VITAMIN D (ERGOCALCIFEROL) 1.25 MG (50000 UNIT) PO CAPS
50000.0000 [IU] | ORAL_CAPSULE | ORAL | 0 refills | Status: DC
Start: 2022-11-19 — End: 2022-12-11

## 2022-11-19 NOTE — Progress Notes (Signed)
.smr  Office: [phone]  /  Fax: [phone]  WEIGHT SUMMARY AND BIOMETRICS  Vitals Temp: 97.7 F (36.5 C) BP: 138/83 Pulse Rate: 69 SpO2: 98 %   Anthropometric Measurements Height: 5\' 5"  (1.651 m) Weight: 273 lb (123.8 kg) BMI (Calculated): 45.43 Weight at Last Visit: 277 lb Weight Lost Since Last Visit: 4 lb Weight Gained Since Last Visit: 0 Starting Weight: 287 lb Total Weight Loss (lbs): 14 lb (6.35 kg) Peak Weight: 287 lb   Body Composition  Body Fat %: 53.2 % Fat Mass (lbs): 145.6 lbs Muscle Mass (lbs): 121.6 lbs Visceral Fat Rating : 18   Other Clinical Data Fasting: no Labs: no Today's Visit #: 4 Starting Date: 09/11/22     HPI  Chief Complaint: OBESITY  Kara Wilkinson is here to discuss her progress with her obesity treatment plan. She is on the the Category 3 Plan and states she is following her eating plan approximately 50 % of the time. She states she is exercising 0 minutes 0 times per week.   Interval History:  Since last office visit she is down 4 lbs Bio impedence scale reviewed with the patient: Up in muscle mass Down in adipose mass  Hunger/appetite-moderate control when eating on plan Cravings- doing some comfort eating during the past few weeks Stress- increased work stress related to [facility] reorganization and mother has been ill- Lives in assisted living Sleep- feels mostly restorative Exercise-nothing consistently at this point. Energy level is improved overall Hydration-working on increasing water intake. Indulges in mini can of Sprite    Pharmacotherapy: None for weight loss.  We discussed medication, bupropion and topiramate as medications which can help with emotional eating/cravings, but she would like to continue to work on nutritional strategies and other strategies for emotional eating/comfort eating at this time.   TREATMENT PLAN FOR OBESITY: Down 14 lbs since 09/11/22 TBW loss 4.9% Recommended Dietary Goals  Kara Wilkinson is  currently in the action stage of change. As such, her goal is to continue weight management plan. She has agreed to the Category 3 Plan.  Behavioral Intervention  We discussed the following Behavioral Modification Strategies today: increasing lean protein intake, decreasing simple carbohydrates , increasing vegetables, increasing lower glycemic fruits, increasing water intake, continue to practice mindfulness when eating, and planning for success.  Additional resources provided today: NA  Recommended Physical Activity Goals  Kara Wilkinson has been advised to work up to 150 minutes of moderate intensity aerobic activity a week and strengthening exercises 2-3 times per week for cardiovascular health, weight loss maintenance and preservation of muscle mass.   She has agreed to Continue current level of physical activity  and Increase physical activity in their day and reduce sedentary time (increase NEAT).   Pharmacotherapy We discussed various medication options to help Kara Wilkinson with her weight loss efforts and we both agreed to continue to work on nutritional and behavioral strategies to promote weight loss.      No follow-ups on file.Marland Kitchen She was informed of the importance of frequent follow up visits to maximize her success with intensive lifestyle modifications for her multiple health conditions.  PHYSICAL EXAM:  Blood pressure 138/83, pulse 69, temperature 97.7 F (36.5 C), height 5\' 5"  (1.651 m), weight 273 lb (123.8 kg), SpO2 98%. Body mass index is 45.43 kg/m.  General: She is overweight, cooperative, alert, well developed, and in no acute distress. PSYCH: Has normal mood, affect and thought process.   Cardiovascular: HR 60's BP 138/83 Lungs: Normal breathing effort, no conversational  dyspnea. Neuro: no focal deficits  DIAGNOSTIC DATA REVIEWED:  BMET    Component Value Date/Time   NA 138 07/25/2022 1322   K 3.8 07/25/2022 1322   CL 104 07/25/2022 1322   CO2 26 07/25/2022  1322   GLUCOSE 78 07/25/2022 1322   BUN 15 07/25/2022 1322   CREATININE 0.70 07/25/2022 1322   CREATININE 0.77 07/23/2022 1236   CALCIUM 8.9 07/25/2022 1322   GFRNONAA >60 07/25/2022 1322   Lab Results  Component Value Date   HGBA1C 6.0 (H) 09/11/2022   Lab Results  Component Value Date   INSULIN 41.4 (H) 09/11/2022   Lab Results  Component Value Date   TSH 2.34 07/23/2022   CBC    Component Value Date/Time   WBC 12.2 (H) 07/25/2022 1322   RBC 4.10 07/25/2022 1322   HGB 12.3 07/25/2022 1322   HCT 37.6 07/25/2022 1322   PLT 333 07/25/2022 1322   MCV 91.7 07/25/2022 1322   MCH 30.0 07/25/2022 1322   MCHC 32.7 07/25/2022 1322   RDW 13.2 07/25/2022 1322   Iron Studies No results found for: "IRON", "TIBC", "FERRITIN", "IRONPCTSAT" Lipid Panel     Component Value Date/Time   CHOL 152 09/11/2022 0846   TRIG 120 09/11/2022 0846   HDL 38 (L) 09/11/2022 0846   CHOLHDL 6.1 (H) 01/31/2022 0939   VLDL 23 05/17/2014 0915   LDLCALC 92 09/11/2022 0846   LDLCALC 170 (H) 01/31/2022 0939   Hepatic Function Panel     Component Value Date/Time   PROT 7.3 07/23/2022 1236   ALBUMIN 3.9 05/17/2014 0915   AST 19 07/23/2022 1236   ALT 26 07/23/2022 1236   ALKPHOS 80 05/17/2014 0915   BILITOT 0.8 07/23/2022 1236      Component Value Date/Time   TSH 2.34 07/23/2022 1236   Nutritional Lab Results  Component Value Date   VD25OH 10.7 (L) 09/11/2022   VD25OH 25 (L) 05/17/2014    ASSOCIATED CONDITIONS ADDRESSED TODAY  ASSESSMENT AND PLAN  Problem List Items Addressed This Visit     Morbid obesity (HCC)   Vitamin D deficiency   Prediabetes - Primary   Vitamin B12 deficiency   Low HDL (under 40)  Prediabetes Last A1c was 6.0/Insulin 41.4 -not at goals Medication(s): None Polyphagia:No She has made good progress with weight loss thus far and would like to continue to work on nutrition and exercise to promote further weight loss.  Lab Results  Component Value Date    HGBA1C 6.0 (H) 09/11/2022   Lab Results  Component Value Date   INSULIN 41.4 (H) 09/11/2022    Plan:  She would like to continue to work on her nutrition plan to decrease simple carbohydrates, increase lean proteins and exercise to promote weight loss, improve glycemic control and prevent progression to Type 2 diabetes.  She may be a good candidate for incretin therapy as well.   Vitamin D Deficiency Vitamin D is not at goal of 50.  Most recent vitamin D level was 10.7. She is on  prescription ergocalciferol 50,000 IU weekly. Lab Results  Component Value Date   VD25OH 10.7 (L) 09/11/2022   VD25OH 25 (L) 05/17/2014    Plan: Continue and refill  prescription ergocalciferol 50,000 IU weekly Low vitamin D levels can be associated with adiposity and may result in leptin resistance and weight gain. Also associated with fatigue. Currently on vitamin D supplementation without any adverse effects.  Recheck vitamin D level in about 2 months to optimize supplementation.  Low B 12 level:  On Prenatal vitamin for supplementation.  Plan: Continue prenatal vitamin. Recheck B 12 level in about 2 months to optimize supplementation.   Hyperlipidemia- Low HDL LDL is at goal. HDl < 40 Medication(s): Crestor 5 mg daily Cardiovascular risk factors: dyslipidemia, obesity (BMI >= 30 kg/m2), and sedentary lifestyle  Lab Results  Component Value Date   CHOL 152 09/11/2022   HDL 38 (L) 09/11/2022   LDLCALC 92 09/11/2022   TRIG 120 09/11/2022   CHOLHDL 6.1 (H) 01/31/2022   CHOLHDL 5.4 05/17/2014   Lab Results  Component Value Date   ALT 26 07/23/2022   AST 19 07/23/2022   ALKPHOS 80 05/17/2014   BILITOT 0.8 07/23/2022   The 10-year ASCVD risk score (Arnett DK, et al., 2019) is: 1.4%   Values used to calculate the score:     Age: 46 years     Sex: Female     Is Non-Hispanic African American: No     Diabetic: No     Tobacco smoker: No     Systolic Blood Pressure: 138 mmHg     Is BP  treated: No     HDL Cholesterol: 38 mg/dL     Total Cholesterol: 152 mg/dL  Plan: Continue Crestor 5 mg daily Continue to work on nutrition plan -decreasing simple carbohydrates, increasing lean proteins, decreasing saturated fats and cholesterol , avoiding trans fats and exercise as able to promote weight loss, improve lipids and decrease cardiovascular risks.     ATTESTASTION STATEMENTS:  Reviewed by clinician on day of visit: allergies, medications, problem list, medical history, surgical history, family history, social history, and previous encounter notes.   I have personally spent 44 minutes total time today in preparation, patient care, nutritional counseling and documentation for this visit, including the following: review of clinical lab tests; review of medical tests/procedures/services.      Skyeler Scalese, PA-C

## 2022-12-11 ENCOUNTER — Ambulatory Visit (INDEPENDENT_AMBULATORY_CARE_PROVIDER_SITE_OTHER): Payer: 59 | Admitting: Physician Assistant

## 2022-12-11 ENCOUNTER — Encounter (INDEPENDENT_AMBULATORY_CARE_PROVIDER_SITE_OTHER): Payer: Self-pay | Admitting: Physician Assistant

## 2022-12-11 VITALS — BP 131/78 | HR 57 | Temp 98.3°F | Ht 65.0 in | Wt 268.0 lb

## 2022-12-11 DIAGNOSIS — E559 Vitamin D deficiency, unspecified: Secondary | ICD-10-CM | POA: Diagnosis not present

## 2022-12-11 DIAGNOSIS — R7303 Prediabetes: Secondary | ICD-10-CM | POA: Diagnosis not present

## 2022-12-11 DIAGNOSIS — E538 Deficiency of other specified B group vitamins: Secondary | ICD-10-CM

## 2022-12-11 DIAGNOSIS — Z6841 Body Mass Index (BMI) 40.0 and over, adult: Secondary | ICD-10-CM

## 2022-12-11 MED ORDER — VITAMIN D (ERGOCALCIFEROL) 1.25 MG (50000 UNIT) PO CAPS: 50000.0000 [IU] | ORAL_CAPSULE | ORAL | 0 refills | Status: DC

## 2022-12-11 NOTE — Progress Notes (Signed)
.smr  Office: [phone]  /  Fax: [phone]  WEIGHT SUMMARY AND BIOMETRICS  Vitals Temp: 98.3 F (36.8 C) BP: 131/78 Pulse Rate: (!) 57 SpO2: 97 %   Anthropometric Measurements Height: 5\' 5"  (1.651 m) Weight: 268 lb (121.6 kg) BMI (Calculated): 44.6 Weight at Last Visit: 273 lb Weight Lost Since Last Visit: 5 lb Weight Gained Since Last Visit: 0 Starting Weight: 287 lb Total Weight Loss (lbs): 19 lb (8.618 kg) Peak Weight: 287 lb   Body Composition  Body Fat %: 53 % Fat Mass (lbs): 142.4 lbs Muscle Mass (lbs): 120 lbs Visceral Fat Rating : 17   Other Clinical Data Fasting: no Labs: no Today's Visit #: 5 Starting Date: 09/11/22     HPI  Chief Complaint: OBESITY  Kymara is here to discuss her progress with her obesity treatment plan. She is on the the Category 3 Plan and states she is following her eating plan approximately 90 % of the time. She states she is exercising 0 minutes 0 times per week.   Interval History:  Since last office visit she down 5 lbs Bio impedence scale reviewed with the patient:  Down 1.6 lbs muscle mass Down 3.2 lbs adipose mass  Hunger/appetite-moderate control.  Cravings- some increased chocolate cravings- Had menstrual cycle for first time in long time- Has Mirena IUD - good thru 2028- Has follow up with OBGYN in Oct for annual wellness visit.  Stress- Manageable, work stress remains very high.  Sleep- Using CPAP. Waking more frequently lately, but able to return to sleep and feels mostly restorative Exercise-nothing consistent. She is concerned about increased hunger if starts exercising regularly and we discussed eating a protein based snack before beginning an activity. We discussed trying to walk 10-15 minutes 2-3 days weekly Hydration-increase to 80 oz daily   Pharmacotherapy: None for weight loss  TREATMENT PLAN FOR OBESITY:  Recommended Dietary Goals  Adalia is currently in the action stage of change. As  such, her goal is to continue weight management plan. She has agreed to the Category 3 Plan.  Behavioral Intervention  We discussed the following Behavioral Modification Strategies today: increasing lean protein intake, decreasing simple carbohydrates , increasing vegetables, increasing lower glycemic fruits, increasing fiber rich foods, increasing water intake, decreasing eating out or consumption of processed foods, and making healthy choices when eating convenient foods, emotional eating strategies and understanding the difference between hunger signals and cravings, work on managing stress, creating time for self-care and relaxation measures, continue to practice mindfulness when eating, and planning for success.  Additional resources provided today: NA  Recommended Physical Activity Goals  Halana has been advised to work up to 150 minutes of moderate intensity aerobic activity a week and strengthening exercises 2-3 times per week for cardiovascular health, weight loss maintenance and preservation of muscle mass.   She has agreed to Think about ways to increase daily physical activity and overcoming barriers to exercise and start walking 10-15 minutes 2-3 times daily   Pharmacotherapy We discussed various medication options to help Eluteria with her weight loss efforts and we both agreed to continue to work on nutritional and behavioral strategies to promote weight loss.      Return in about 3 weeks (around 01/01/2023).Marland Kitchen She was informed of the importance of frequent follow up visits to maximize her success with intensive lifestyle modifications for her multiple health conditions.  PHYSICAL EXAM:  Blood pressure 131/78, pulse (!) 57, temperature 98.3 F (36.8 C), height 5\' 5"  (1.651 m),  weight 268 lb (121.6 kg), SpO2 97%. Body mass index is 44.6 kg/m.  General: She is overweight, cooperative, alert, well developed, and in no acute distress. PSYCH: Has normal mood, affect and  thought process.   Cardiovascular: HR 50's , BP 131/78 Lungs: Normal breathing effort, no conversational dyspnea. Neuro: grossly intact  DIAGNOSTIC DATA REVIEWED:  BMET    Component Value Date/Time   NA 138 07/25/2022 1322   K 3.8 07/25/2022 1322   CL 104 07/25/2022 1322   CO2 26 07/25/2022 1322   GLUCOSE 78 07/25/2022 1322   BUN 15 07/25/2022 1322   CREATININE 0.70 07/25/2022 1322   CREATININE 0.77 07/23/2022 1236   CALCIUM 8.9 07/25/2022 1322   GFRNONAA >60 07/25/2022 1322   Lab Results  Component Value Date   HGBA1C 6.0 (H) 09/11/2022   Lab Results  Component Value Date   INSULIN 41.4 (H) 09/11/2022   Lab Results  Component Value Date   TSH 2.34 07/23/2022   CBC    Component Value Date/Time   WBC 12.2 (H) 07/25/2022 1322   RBC 4.10 07/25/2022 1322   HGB 12.3 07/25/2022 1322   HCT 37.6 07/25/2022 1322   PLT 333 07/25/2022 1322   MCV 91.7 07/25/2022 1322   MCH 30.0 07/25/2022 1322   MCHC 32.7 07/25/2022 1322   RDW 13.2 07/25/2022 1322   Iron Studies No results found for: "IRON", "TIBC", "FERRITIN", "IRONPCTSAT" Lipid Panel     Component Value Date/Time   CHOL 152 09/11/2022 0846   TRIG 120 09/11/2022 0846   HDL 38 (L) 09/11/2022 0846   CHOLHDL 6.1 (H) 01/31/2022 0939   VLDL 23 05/17/2014 0915   LDLCALC 92 09/11/2022 0846   LDLCALC 170 (H) 01/31/2022 0939   Hepatic Function Panel     Component Value Date/Time   PROT 7.3 07/23/2022 1236   ALBUMIN 3.9 05/17/2014 0915   AST 19 07/23/2022 1236   ALT 26 07/23/2022 1236   ALKPHOS 80 05/17/2014 0915   BILITOT 0.8 07/23/2022 1236      Component Value Date/Time   TSH 2.34 07/23/2022 1236   Nutritional Lab Results  Component Value Date   VD25OH 10.7 (L) 09/11/2022   VD25OH 25 (L) 05/17/2014    ASSOCIATED CONDITIONS ADDRESSED TODAY  ASSESSMENT AND PLAN  Problem List Items Addressed This Visit     Morbid obesity (HCC)   Vitamin D deficiency   Relevant Medications   Vitamin D,  Ergocalciferol, (DRISDOL) 1.25 MG (50000 UNIT) CAPS capsule   Prediabetes - Primary   Vitamin B12 deficiency   BMI 40.0-44.9, adult (HCC)   Prediabetes Last A1c was 6.0- not at goal/ Insulin 41.4 very elevated, not at goal.   Medication(s): None Polyphagia:No She is working on nutrition plan to decrease simple carbohydrates, increase lean proteins and exercise to promote weight loss, improve glycemic control and prevent progression to Type 2 diabetes.   Lab Results  Component Value Date   HGBA1C 6.0 (H) 09/11/2022   Lab Results  Component Value Date   INSULIN 41.4 (H) 09/11/2022    Plan:  Continue working on nutrition plan to decrease simple carbohydrates, increase lean proteins and exercise to promote weight loss, improve glycemic control and prevent progression to Type 2 diabetes.  She has lost 19 lbs since 09/11/22. TBW loss of 6.6%. Will plan to recheck A1c level over the next 1-2 months.   Vitamin D Deficiency Vitamin D is not at goal of 50.  Most recent vitamin D level was 10.7. She  is on  prescription ergocalciferol 50,000 IU weekly. No N/V or muscle weakness or other side effects with Ergocalciferol. Endorses fatigue.  Lab Results  Component Value Date   VD25OH 10.7 (L) 09/11/2022   VD25OH 25 (L) 05/17/2014    Plan: Continue and refill  prescription ergocalciferol 50,000 IU weekly Low vitamin D levels can be associated with adiposity and may result in leptin resistance and weight gain. Also associated with fatigue. Currently on vitamin D supplementation without any adverse effects.  Recheck vitamin D level over the next 1-2 months.   B 12 deficiency: Taking prenatal vitamins. No side effects.  Plan : Continue prenatal vitamins. Recheck level over next 1-2 months.   ATTESTASTION STATEMENTS:  Reviewed by clinician on day of visit: allergies, medications, problem list, medical history, surgical history, family history, social history, and previous encounter notes.    I have personally spent 40 minutes total time today in preparation, patient care, nutritional counseling and documentation for this visit, including the following: review of clinical lab tests; review of medical tests/procedures/services.      Olawale Marney, PA-C

## 2022-12-17 ENCOUNTER — Telehealth (INDEPENDENT_AMBULATORY_CARE_PROVIDER_SITE_OTHER): Payer: Self-pay | Admitting: Physician Assistant

## 2022-12-17 NOTE — Telephone Encounter (Signed)
I contacted the patient to reschedule the appointment with Dr. Sharee Holster on 01/01/2023 and 01/22/2023, as she is currently out of the office. S

## 2023-01-01 ENCOUNTER — Encounter (INDEPENDENT_AMBULATORY_CARE_PROVIDER_SITE_OTHER): Payer: Self-pay | Admitting: Physician Assistant

## 2023-01-01 ENCOUNTER — Ambulatory Visit (INDEPENDENT_AMBULATORY_CARE_PROVIDER_SITE_OTHER): Payer: 59 | Admitting: Physician Assistant

## 2023-01-01 VITALS — BP 138/78 | HR 62 | Temp 98.2°F | Ht 65.0 in | Wt 266.0 lb

## 2023-01-01 DIAGNOSIS — Z6841 Body Mass Index (BMI) 40.0 and over, adult: Secondary | ICD-10-CM

## 2023-01-01 DIAGNOSIS — E559 Vitamin D deficiency, unspecified: Secondary | ICD-10-CM | POA: Diagnosis not present

## 2023-01-01 DIAGNOSIS — R638 Other symptoms and signs concerning food and fluid intake: Secondary | ICD-10-CM

## 2023-01-01 DIAGNOSIS — R7303 Prediabetes: Secondary | ICD-10-CM | POA: Diagnosis not present

## 2023-01-01 MED ORDER — VITAMIN D (ERGOCALCIFEROL) 1.25 MG (50000 UNIT) PO CAPS
50000.0000 [IU] | ORAL_CAPSULE | ORAL | 0 refills | Status: DC
Start: 2023-01-01 — End: 2023-01-22

## 2023-01-01 MED ORDER — TOPIRAMATE 25 MG PO TABS
25.0000 mg | ORAL_TABLET | Freq: Every evening | ORAL | 0 refills | Status: DC
Start: 2023-01-01 — End: 2023-01-22

## 2023-01-01 NOTE — Progress Notes (Unsigned)
.smr  Office: [phone]  /  Fax: [phone]  WEIGHT SUMMARY AND BIOMETRICS  Vitals Temp: 98.2 F (36.8 C) BP: 138/78 Pulse Rate: 62 SpO2: 98 %   Anthropometric Measurements Height: 5\' 5"  (1.651 m) Weight: 266 lb (120.7 kg) BMI (Calculated): 44.26 Weight at Last Visit: 268 lb Weight Lost Since Last Visit: 2 lb Weight Gained Since Last Visit: 0 Starting Weight: 287 lb Total Weight Loss (lbs): 21 lb (9.526 kg) Peak Weight: 287 lb   Body Composition  Body Fat %: 53 % Fat Mass (lbs): 141 lbs Muscle Mass (lbs): 118.8 lbs Visceral Fat Rating : 17   Other Clinical Data Fasting: no Labs: no Today's Visit #: 6 Starting Date: 09/11/22     HPI  Chief Complaint: OBESITY  Kara Wilkinson is here to discuss her progress with her obesity treatment plan. She is on the the Category 3 Plan and states she is following her eating plan approximately 50 % of the time. She states she is exercising walking/dancing 20 minutes 2 times per week. Discussed the use of AI scribe software for clinical note transcription with the patient, who gave verbal consent to proceed.  History of Present Illness/       Interval History:  Since last office visit she down 2 lbs. Down total of 21 lbs.   Kara Wilkinson, a 47 year old female with a history of prediabetes and vitamin D deficiency, presents for a follow-up visit regarding her obesity treatment plan. She reports struggling with food cravings, often eating snacks even when she is not hungry. Despite these cravings, she maintains that she is adhering to a diet consisting mostly of protein and low-carb, low-calorie foods. She also mentions changes in her bowel movements, going from three times a day to once every other day, which she attributes to her increased protein and cheese intake. She has tried fiber supplements to help with this issue, but they upset her stomach. She also mentions that she has been more active, doing some dancing and walking, as well  as heavy lifting while cleaning her garage. Pharmacotherapy: None for weight loss.  TREATMENT PLAN FOR OBESITY:  Recommended Dietary Goals  Kara Wilkinson is currently in the action stage of change. As such, her goal is to continue weight management plan. She has agreed to the Category 3 Plan.  Behavioral Intervention  We discussed the following Behavioral Modification Strategies today: increasing lean protein intake, decreasing simple carbohydrates , increasing vegetables, increasing lower glycemic fruits, increasing water intake, decreasing eating out or consumption of processed foods, and making healthy choices when eating convenient foods, emotional eating strategies and understanding the difference between hunger signals and cravings, work on managing stress, creating time for self-care and relaxation measures, continue to practice mindfulness when eating, and planning for success.  Additional resources provided today: NA  Recommended Physical Activity Goals  Kara Wilkinson has been advised to work up to 150 minutes of moderate intensity aerobic activity a week and strengthening exercises 2-3 times per week for cardiovascular health, weight loss maintenance and preservation of muscle mass.   She has agreed to Continue current level of physical activity    Pharmacotherapy We discussed various medication options to help Kara Wilkinson with her weight loss efforts and we both agreed to start topiramate 25 mg nightly for cravings/emotional eating.    Return in about 3 weeks (around 01/22/2023).Marland Kitchen She was informed of the importance of frequent follow up visits to maximize her success with intensive lifestyle modifications for her multiple health conditions.  PHYSICAL EXAM:  Blood pressure 138/78, pulse 62, temperature 98.2 F (36.8 C), height 5\' 5"  (1.651 m), weight 266 lb (120.7 kg), SpO2 98%. Body mass index is 44.26 kg/m.  General: She is overweight, cooperative, alert, well developed, and in no  acute distress. PSYCH: Has normal mood, affect and thought process.   Cardiovascular: HR 60's BP 138/78 Lungs: Normal breathing effort, no conversational dyspnea.  DIAGNOSTIC DATA REVIEWED:  BMET    Component Value Date/Time   NA 138 07/25/2022 1322   K 3.8 07/25/2022 1322   CL 104 07/25/2022 1322   CO2 26 07/25/2022 1322   GLUCOSE 78 07/25/2022 1322   BUN 15 07/25/2022 1322   CREATININE 0.70 07/25/2022 1322   CREATININE 0.77 07/23/2022 1236   CALCIUM 8.9 07/25/2022 1322   GFRNONAA >60 07/25/2022 1322   Lab Results  Component Value Date   HGBA1C 6.0 (H) 09/11/2022   Lab Results  Component Value Date   INSULIN 41.4 (H) 09/11/2022   Lab Results  Component Value Date   TSH 2.34 07/23/2022   CBC    Component Value Date/Time   WBC 12.2 (H) 07/25/2022 1322   RBC 4.10 07/25/2022 1322   HGB 12.3 07/25/2022 1322   HCT 37.6 07/25/2022 1322   PLT 333 07/25/2022 1322   MCV 91.7 07/25/2022 1322   MCH 30.0 07/25/2022 1322   MCHC 32.7 07/25/2022 1322   RDW 13.2 07/25/2022 1322   Iron Studies No results found for: "IRON", "TIBC", "FERRITIN", "IRONPCTSAT" Lipid Panel     Component Value Date/Time   CHOL 152 09/11/2022 0846   TRIG 120 09/11/2022 0846   HDL 38 (L) 09/11/2022 0846   CHOLHDL 6.1 (H) 01/31/2022 0939   VLDL 23 05/17/2014 0915   LDLCALC 92 09/11/2022 0846   LDLCALC 170 (H) 01/31/2022 0939   Hepatic Function Panel     Component Value Date/Time   PROT 7.3 07/23/2022 1236   ALBUMIN 3.9 05/17/2014 0915   AST 19 07/23/2022 1236   ALT 26 07/23/2022 1236   ALKPHOS 80 05/17/2014 0915   BILITOT 0.8 07/23/2022 1236      Component Value Date/Time   TSH 2.34 07/23/2022 1236   Nutritional Lab Results  Component Value Date   VD25OH 10.7 (L) 09/11/2022   VD25OH 25 (L) 05/17/2014    ASSOCIATED CONDITIONS ADDRESSED TODAY  ASSESSMENT AND PLAN  Problem List Items Addressed This Visit     Morbid obesity (HCC)   Vitamin D deficiency   Relevant Medications    Vitamin D, Ergocalciferol, (DRISDOL) 1.25 MG (50000 UNIT) CAPS capsule   Prediabetes - Primary   BMI 40.0-44.9, adult (HCC)   Abnormal craving   Relevant Medications   topiramate (TOPAMAX) 25 MG tablet  Obesity Struggling with cravings and occasional overeating despite maintaining a high protein, low carb diet. Unsuccessful trial of fiber pills due to gastrointestinal upset. Lost 21 pounds overall and 1.4 pounds of adipose tissue since last visit. -Start Topiramate 25mg  nightly to help control cravings. -Continue current diet and physical activity. -Check weight and discuss progress at next visit.  Vitamin D deficiency On supplementation, no reported side effects. -Continue Vitamin D supplementation. -Check Vitamin D level at next visit.  Prediabetes No recent labs, but maintaining a low carb diet and has lost weight. -Check A1C, fasting insulin, and B12 at next fasting visit on 01/13/2023.  General Health Maintenance -Continue CPAP for sleep apnea. -Annual wellness visit with OBGYN in October. -Next visit with primary care provider in 2024.  ATTESTASTION STATEMENTS:  Reviewed by clinician on day of visit: allergies, medications, problem list, medical history, surgical history, family history, social history, and previous encounter notes.   I have personally spent 30 minutes total time today in preparation, patient care, nutritional counseling and documentation for this visit, including the following: review of clinical lab tests; review of medical tests/procedures/services.      Katiya Fike, PA-C

## 2023-01-14 LAB — HM MAMMOGRAPHY

## 2023-01-21 NOTE — Progress Notes (Unsigned)
.smr  Office: [phone]  /  Fax: [phone]  WEIGHT SUMMARY AND BIOMETRICS  Vitals Temp: (!) 97.5 F (36.4 C) BP: 131/84 Pulse Rate: (!) 54 SpO2: 100 %   Anthropometric Measurements Height: 5\' 5"  (1.651 m) Weight: 262 lb (118.8 kg) BMI (Calculated): 43.6 Weight at Last Visit: 266 lb Weight Lost Since Last Visit: 4 lb Weight Gained Since Last Visit: 0 Starting Weight: 287 lb Total Weight Loss (lbs): 25 lb (11.3 kg) Peak Weight: 287 lb   Body Composition  Body Fat %: 52 % Fat Mass (lbs): 136.4 lbs Muscle Mass (lbs): 119.6 lbs Visceral Fat Rating : 16   Other Clinical Data Fasting: no Labs: no Today's Visit #: 7 Starting Date: 09/11/22     HPI  Chief Complaint: OBESITY  Jewell is here to discuss her progress with her obesity treatment plan. She is on the the Category 3 Plan and states she is following her eating plan approximately 50 % of the time. She states she is exercising walking 20  minutes 3 times per week. Discussed the use of AI scribe software for clinical note transcription with the patient, who gave verbal consent to proceed.  History of Present Illness  /   Interval History:  Since last office visit she is down 4 lbs.  Bio impedence scale reviewed with the patient:  Muscle mass + 0.8 lb  Adipose mass - 4.6 lbs!   Down 25 lbs since 09/11/2022 TBW loss of 8.7% !!  She has been actively working on building muscle mass and has seen a decrease in her adipose mass. The patient is also planning for her husband's upcoming surgery and is we discussed strategies to maintain her diet during this period.   Pharmacotherapy: topamax 25 mg daily-  reports a positive response to topiramate, which she takes for cravings. Initially, she experienced a headache, which she attributed to the medication. However, after discontinuing and then restarting the medication, the headache did not recur. The patient believes the medication is effective as she often  forgets to eat and has noticed a decrease in her craving for candy.   TREATMENT PLAN FOR OBESITY:  Obesity Patient reports decreased food cravings and reduced snacking since starting Topiramate 25mg . Noted increased muscle mass and decreased adipose tissue. -Continue Topiramate 25mg  daily. -Encourage continued focus on protein-rich meals and mindful eating. -Refill Topiramate prescription to ensure continuity of medication. Recommended Dietary Goals  Sakiyah is currently in the action stage of change. As such, her goal is to continue weight management plan. She has agreed to the Category 3 Plan.  Behavioral Intervention  We discussed the following Behavioral Modification Strategies today: continue to work on maintaining a reduced calorie state, getting the recommended amount of protein, incorporating whole foods, making healthy choices, staying well hydrated and practicing mindfulness when eating. and discussed strategies like packing snacks and using cooler to carry yogurt or other foods while staying with husband during post op hospital stay.  .  Additional resources provided today: Provided dining out guide with Calorie goal 1500 calories        100 + grams of protein daily  Recommended Physical Activity Goals  Latonya has been advised to work up to 150 minutes of moderate intensity aerobic activity a week and strengthening exercises 2-3 times per week for cardiovascular health, weight loss maintenance and preservation of muscle mass.   She has agreed to Continue current level of physical activity  and Increase physical activity in their day and reduce sedentary  time (increase NEAT).   Pharmacotherapy We discussed various medication options to help Pinkey with her weight loss efforts and we both agreed to continue topamax for cravings and continue to work on nutritional and behavioral strategies to promote weight loss.      Return in about 3 weeks (around 02/12/2023) for  Fasting Lab.Marland Kitchen She was informed of the importance of frequent follow up visits to maximize her success with intensive lifestyle modifications for her multiple health conditions.  PHYSICAL EXAM:  Blood pressure 131/84, pulse (!) 54, temperature (!) 97.5 F (36.4 C), height 5\' 5"  (1.651 m), weight 262 lb (118.8 kg), SpO2 100%. Body mass index is 43.6 kg/m.  General: She is overweight, cooperative, alert, well developed, and in no acute distress. PSYCH: Has normal mood, affect and thought process.   Cardiovascular: HR 50's BP 131/84. No peripheral edema Lungs: Normal breathing effort, no conversational dyspnea. Neuro: no gross focal deficit  DIAGNOSTIC DATA REVIEWED:  BMET    Component Value Date/Time   NA 138 07/25/2022 1322   K 3.8 07/25/2022 1322   CL 104 07/25/2022 1322   CO2 26 07/25/2022 1322   GLUCOSE 78 07/25/2022 1322   BUN 15 07/25/2022 1322   CREATININE 0.70 07/25/2022 1322   CREATININE 0.77 07/23/2022 1236   CALCIUM 8.9 07/25/2022 1322   GFRNONAA >60 07/25/2022 1322   Lab Results  Component Value Date   HGBA1C 6.0 (H) 09/11/2022   Lab Results  Component Value Date   INSULIN 41.4 (H) 09/11/2022   Lab Results  Component Value Date   TSH 2.34 07/23/2022   CBC    Component Value Date/Time   WBC 12.2 (H) 07/25/2022 1322   RBC 4.10 07/25/2022 1322   HGB 12.3 07/25/2022 1322   HCT 37.6 07/25/2022 1322   PLT 333 07/25/2022 1322   MCV 91.7 07/25/2022 1322   MCH 30.0 07/25/2022 1322   MCHC 32.7 07/25/2022 1322   RDW 13.2 07/25/2022 1322   Iron Studies No results found for: "IRON", "TIBC", "FERRITIN", "IRONPCTSAT" Lipid Panel     Component Value Date/Time   CHOL 152 09/11/2022 0846   TRIG 120 09/11/2022 0846   HDL 38 (L) 09/11/2022 0846   CHOLHDL 6.1 (H) 01/31/2022 0939   VLDL 23 05/17/2014 0915   LDLCALC 92 09/11/2022 0846   LDLCALC 170 (H) 01/31/2022 0939   Hepatic Function Panel     Component Value Date/Time   PROT 7.3 07/23/2022 1236   ALBUMIN  3.9 05/17/2014 0915   AST 19 07/23/2022 1236   ALT 26 07/23/2022 1236   ALKPHOS 80 05/17/2014 0915   BILITOT 0.8 07/23/2022 1236      Component Value Date/Time   TSH 2.34 07/23/2022 1236   Nutritional Lab Results  Component Value Date   VD25OH 10.7 (L) 09/11/2022   VD25OH 25 (L) 05/17/2014    ASSOCIATED CONDITIONS ADDRESSED TODAY  ASSESSMENT AND PLAN  Problem List Items Addressed This Visit     Morbid obesity (HCC)   Vitamin D deficiency   Relevant Medications   Vitamin D, Ergocalciferol, (DRISDOL) 1.25 MG (50000 UNIT) CAPS capsule   Prediabetes - Primary   Abnormal craving   Relevant Medications   topiramate (TOPAMAX) 25 MG tablet  Prediabetes Last A1c was 6.0  Medication(s): None Polyphagia:No She is working on nutrition plan to decrease simple carbohydrates, increase lean proteins and exercise to promote weight loss, improve glycemic control and prevent progression to Type 2 diabetes.   Lab Results  Component Value Date  HGBA1C 6.0 (H) 09/11/2022   Lab Results  Component Value Date   INSULIN 41.4 (H) 09/11/2022    Plan:  Continue working on nutrition plan to decrease simple carbohydrates, increase lean proteins and exercise to promote weight loss, improve glycemic control and prevent progression to Type 2 diabetes.  Plan to recheck fasting labs next visit.   Vitamin D Deficiency Vitamin D is not at goal of 50.  Most recent vitamin D level was 10.7. She is on  prescription ergocalciferol 50,000 IU weekly. Lab Results  Component Value Date   VD25OH 10.7 (L) 09/11/2022   VD25OH 25 (L) 05/17/2014    Plan: Continue and refill  prescription ergocalciferol 50,000 IU weekly Low vitamin D levels can be associated with adiposity and may result in leptin resistance and weight gain. Also associated with fatigue. Currently on vitamin D supplementation without any adverse effects.  Recheck vitamin D level next visit to optimize supplementation/avoid over  supplementation.  Eating disorder/emotional eating/Cravings Larrisha has had issues with stress/emotional eating/cravings. Currently this is moderately controlled. Overall mood is stable. Medication(s): Topiramate 25 mg daily  Plan: Continue and refill Topiramate 25 mg daily Continue working on strategies for emotional eating such as doing activities she enjoys and finds productive.   General Health Maintenance -Plan for fasting labs on next visit (02/12/2023). -Encourage continued hydration and regular physical activity. -Advise patient to plan for healthy snack options during upcoming potential stressor (husband's surgery). ATTESTASTION STATEMENTS:  Reviewed by clinician on day of visit: allergies, medications, problem list, medical history, surgical history, family history, social history, and previous encounter notes.   I have personally spent 45 minutes total time today in preparation, patient care, nutritional counseling and documentation for this visit, including the following: review of clinical lab tests; review of medical tests/procedures/services.      Omarrion Carmer, PA-C

## 2023-01-22 ENCOUNTER — Encounter (INDEPENDENT_AMBULATORY_CARE_PROVIDER_SITE_OTHER): Payer: Self-pay | Admitting: Physician Assistant

## 2023-01-22 ENCOUNTER — Ambulatory Visit (INDEPENDENT_AMBULATORY_CARE_PROVIDER_SITE_OTHER): Payer: 59 | Admitting: Physician Assistant

## 2023-01-22 VITALS — BP 131/84 | HR 54 | Temp 97.5°F | Ht 65.0 in | Wt 262.0 lb

## 2023-01-22 DIAGNOSIS — R638 Other symptoms and signs concerning food and fluid intake: Secondary | ICD-10-CM | POA: Diagnosis not present

## 2023-01-22 DIAGNOSIS — E559 Vitamin D deficiency, unspecified: Secondary | ICD-10-CM

## 2023-01-22 DIAGNOSIS — Z6841 Body Mass Index (BMI) 40.0 and over, adult: Secondary | ICD-10-CM

## 2023-01-22 DIAGNOSIS — R7303 Prediabetes: Secondary | ICD-10-CM | POA: Diagnosis not present

## 2023-01-22 MED ORDER — TOPIRAMATE 25 MG PO TABS
25.0000 mg | ORAL_TABLET | Freq: Every evening | ORAL | 0 refills | Status: DC
Start: 2023-01-22 — End: 2023-02-12

## 2023-01-22 MED ORDER — VITAMIN D (ERGOCALCIFEROL) 1.25 MG (50000 UNIT) PO CAPS: 50000.0000 [IU] | ORAL_CAPSULE | ORAL | 0 refills | Status: DC

## 2023-02-10 ENCOUNTER — Other Ambulatory Visit: Payer: Self-pay | Admitting: Internal Medicine

## 2023-02-12 ENCOUNTER — Ambulatory Visit (INDEPENDENT_AMBULATORY_CARE_PROVIDER_SITE_OTHER): Payer: 59 | Admitting: Physician Assistant

## 2023-02-12 ENCOUNTER — Encounter (INDEPENDENT_AMBULATORY_CARE_PROVIDER_SITE_OTHER): Payer: Self-pay | Admitting: Physician Assistant

## 2023-02-12 VITALS — BP 135/90 | HR 61 | Temp 98.3°F | Ht 65.0 in | Wt 263.0 lb

## 2023-02-12 DIAGNOSIS — E559 Vitamin D deficiency, unspecified: Secondary | ICD-10-CM

## 2023-02-12 DIAGNOSIS — E538 Deficiency of other specified B group vitamins: Secondary | ICD-10-CM | POA: Diagnosis not present

## 2023-02-12 DIAGNOSIS — R7303 Prediabetes: Secondary | ICD-10-CM

## 2023-02-12 DIAGNOSIS — Z6841 Body Mass Index (BMI) 40.0 and over, adult: Secondary | ICD-10-CM

## 2023-02-12 DIAGNOSIS — E7849 Other hyperlipidemia: Secondary | ICD-10-CM | POA: Diagnosis not present

## 2023-02-12 DIAGNOSIS — R638 Other symptoms and signs concerning food and fluid intake: Secondary | ICD-10-CM

## 2023-02-12 DIAGNOSIS — E786 Lipoprotein deficiency: Secondary | ICD-10-CM

## 2023-02-12 MED ORDER — VITAMIN D (ERGOCALCIFEROL) 1.25 MG (50000 UNIT) PO CAPS: 50000.0000 [IU] | ORAL_CAPSULE | ORAL | 0 refills | Status: DC

## 2023-02-12 NOTE — Progress Notes (Signed)
.smr  Office: [phone]  /  Fax: [phone]  WEIGHT SUMMARY AND BIOMETRICS  Vitals Temp: 98.3 F (36.8 C) BP: (!) 135/90 Pulse Rate: 61 SpO2: 98 %   Anthropometric Measurements Height: 5\' 5"  (1.651 m) Weight: 263 lb (119.3 kg) BMI (Calculated): 43.77 Weight at Last Visit: 262 lb Weight Lost Since Last Visit: 0 Weight Gained Since Last Visit: 1 lb Starting Weight: 287 lb Total Weight Loss (lbs): 24 lb (10.9 kg) Peak Weight: 287 lb   Body Composition  Body Fat %: 52.4 % Fat Mass (lbs): 138 lbs Muscle Mass (lbs): 119 lbs Visceral Fat Rating : 17   Other Clinical Data Fasting: yes Labs: yes Today's Visit #: 8 Starting Date: 09/11/22     HPI  Chief Complaint: OBESITY  Kara Wilkinson is here to discuss her progress with her obesity treatment plan. She is on the the Category 3 Plan and states she is following her eating plan approximately 75-80 % of the time. She states she is exercising walking /pushing brother in wheelchair miles minutes several times per week.   Interval History:  Since last office visit she up 1 lb.   The patient, a 47 year old female with a history of obesity, prediabetes, hyperlipidemia, and vitamin D and B12 deficiencies, presents for a follow-up of her obesity treatment plan.  The patient reports that she has been trying to manage her weight through diet and medication, but has faced some challenges.  She was previously on Topamax, but had to discontinue it due to headaches.  Despite efforts to maintain a healthy diet, the patient admits to some dietary indiscretions during a recent family vacation, including increased consumption of salty snacks and decreased protein intake.  The patient also mentions that she struggles with eating breakfast, often not feeling hungry in the morning. We discussed other options for breakfast including high protein cereal options with [employer] milk or similar and she is willing to try these.     Pharmacotherapy: Stopped topamax as was causing headaches. Was helpful with cravings, but unable to tolerate headaches.   TREATMENT PLAN FOR OBESITY:  Recommended Dietary Goals  Kara Wilkinson is currently in the action stage of change. As such, her goal is to continue weight management plan. She has agreed to the Category 2 Plan.Decreased from Category 3 to promote weight loss.   Behavioral Intervention  We discussed the following Behavioral Modification Strategies today: continue to work on maintaining a reduced calorie state, getting the recommended amount of protein, incorporating whole foods, making healthy choices, staying well hydrated and practicing mindfulness when eating..  Additional resources provided today: Change to Cat. 2 to promote weight loss. (REE= 1627) Cat. 2 Breakfast options.         Recommended Physical Activity Goals  Kara Wilkinson has been advised to work up to 150 minutes of moderate intensity aerobic activity a week and strengthening exercises 2-3 times per week for cardiovascular health, weight loss maintenance and preservation of muscle mass.   She has agreed to Think about enjoyable ways to increase daily physical activity and overcoming barriers to exercise and Increase physical activity in their day and reduce sedentary time (increase NEAT).   Pharmacotherapy We discussed various medication options to help Kara Wilkinson with her weight loss efforts and we both agreed to change to a category 2 nutrition plan to decrease overall calories to promote weight loss. She will continue to work on nutritional and behavioral strategies to promote weight loss.      No follow-ups on file.Marland Kitchen She was  informed of the importance of frequent follow up visits to maximize her success with intensive lifestyle modifications for her multiple health conditions.  PHYSICAL EXAM:  Blood pressure (!) 135/90, pulse 61, temperature 98.3 F (36.8 C), height 5\' 5"  (1.651 m), weight 263 lb  (119.3 kg), SpO2 98%. Body mass index is 43.77 kg/m.  General: She is overweight, cooperative, alert, well developed, and in no acute distress. PSYCH: Has normal mood, affect and thought process.   Cardiovascular: HR 60's BP 135/90 Lungs: Normal breathing effort, no conversational dyspnea.  DIAGNOSTIC DATA REVIEWED:  BMET    Component Value Date/Time   NA 138 07/25/2022 1322   K 3.8 07/25/2022 1322   CL 104 07/25/2022 1322   CO2 26 07/25/2022 1322   GLUCOSE 78 07/25/2022 1322   BUN 15 07/25/2022 1322   CREATININE 0.70 07/25/2022 1322   CREATININE 0.77 07/23/2022 1236   CALCIUM 8.9 07/25/2022 1322   GFRNONAA >60 07/25/2022 1322   Lab Results  Component Value Date   HGBA1C 6.0 (H) 09/11/2022   Lab Results  Component Value Date   INSULIN 41.4 (H) 09/11/2022   Lab Results  Component Value Date   TSH 2.34 07/23/2022   CBC    Component Value Date/Time   WBC 12.2 (H) 07/25/2022 1322   RBC 4.10 07/25/2022 1322   HGB 12.3 07/25/2022 1322   HCT 37.6 07/25/2022 1322   PLT 333 07/25/2022 1322   MCV 91.7 07/25/2022 1322   MCH 30.0 07/25/2022 1322   MCHC 32.7 07/25/2022 1322   RDW 13.2 07/25/2022 1322   Iron Studies No results found for: "IRON", "TIBC", "FERRITIN", "IRONPCTSAT" Lipid Panel     Component Value Date/Time   CHOL 152 09/11/2022 0846   TRIG 120 09/11/2022 0846   HDL 38 (L) 09/11/2022 0846   CHOLHDL 6.1 (H) 01/31/2022 0939   VLDL 23 05/17/2014 0915   LDLCALC 92 09/11/2022 0846   LDLCALC 170 (H) 01/31/2022 0939   Hepatic Function Panel     Component Value Date/Time   PROT 7.3 07/23/2022 1236   ALBUMIN 3.9 05/17/2014 0915   AST 19 07/23/2022 1236   ALT 26 07/23/2022 1236   ALKPHOS 80 05/17/2014 0915   BILITOT 0.8 07/23/2022 1236      Component Value Date/Time   TSH 2.34 07/23/2022 1236   Nutritional Lab Results  Component Value Date   VD25OH 10.7 (L) 09/11/2022   VD25OH 25 (L) 05/17/2014    ASSOCIATED CONDITIONS ADDRESSED  TODAY  ASSESSMENT AND PLAN  Problem List Items Addressed This Visit     Morbid obesity (HCC)   Vitamin D deficiency   Relevant Medications   Vitamin D, Ergocalciferol, (DRISDOL) 1.25 MG (50000 UNIT) CAPS capsule   Other Relevant Orders   VITAMIN D 25 Hydroxy (Vit-D Deficiency, Fractures)   Prediabetes - Primary   Relevant Orders   CMP14+EGFR   Hemoglobin A1c   Insulin, random   Vitamin B12 deficiency   Relevant Orders   Vitamin B12   CBC with Differential/Platelet   BMI 40.0-44.9, adult (HCC)   Low HDL (under 40)   Relevant Orders   Lipid Panel With LDL/HDL Ratio   Abnormal craving   Other hyperlipidemia   Relevant Orders   Lipid Panel With LDL/HDL Ratio   Prediabetes Last A1c was 6.0  Medication(s): None Polyphagia:No She is working on a nutrition plan to decrease simple carbohydrates, increase lean proteins and exercise to promote weight loss, improve glycemic control and prevent progression to Type 2 diabetes.  ]  Lab Results  Component Value Date   HGBA1C 6.0 (H) 09/11/2022   Lab Results  Component Value Date   INSULIN 41.4 (H) 09/11/2022    Plan: Continue working on nutrition plan to decrease simple carbohydrates, increase lean proteins and exercise to promote weight loss, improve glycemic control and prevent progression to Type 2 diabetes.   Recheck CMET, A1c and fasting insulin levels today.   Vitamin D Deficiency Vitamin D is not at goal of 50.  Most recent vitamin D level was 10.7. She is on  prescription ergocalciferol 50,000 IU weekly. No N/V or muscle weakness with Ergocalciferol.  Lab Results  Component Value Date   VD25OH 10.7 (L) 09/11/2022   VD25OH 25 (L) 05/17/2014    Plan: Continue and refill  prescription ergocalciferol 50,000 IU weekly Low vitamin D levels can be associated with adiposity and may result in leptin resistance and weight gain. Also associated with fatigue. Currently on vitamin D supplementation without any adverse effects.   Recheck vitamin D levels today.   Low B 12 level: Low B 12 of 212 on labs. Endorses fatigue. Taking B 12 500 mcg daily without complication or side effect.  Plan: Recheck B 12 level today and adjust supplementation accordingly.   Hyperlipidemia/ HDL< 40 LDL is not at goal. HDL is 38, Trig 120 Medication(s): CRESTOR 5 mg daily. No side effects.  Cardiovascular risk factors: dyslipidemia, obesity (BMI >= 30 kg/m2), and sedentary lifestyle  Lab Results  Component Value Date   CHOL 152 09/11/2022   HDL 38 (L) 09/11/2022   LDLCALC 92 09/11/2022   TRIG 120 09/11/2022   CHOLHDL 6.1 (H) 01/31/2022   CHOLHDL 5.4 05/17/2014   Lab Results  Component Value Date   ALT 26 07/23/2022   AST 19 07/23/2022   ALKPHOS 80 05/17/2014   BILITOT 0.8 07/23/2022   The 10-year ASCVD risk score (Arnett DK, et al., 2019) is: 1.4%   Values used to calculate the score:     Age: 27 years     Sex: Female     Is Non-Hispanic African American: No     Diabetic: No     Tobacco smoker: No     Systolic Blood Pressure: 135 mmHg     Is BP treated: No     HDL Cholesterol: 38 mg/dL     Total Cholesterol: 152 mg/dL  Plan: Continue statin- Crestor 5 mg daily. Continue to work on nutrition plan -decreasing simple carbohydrates, increasing lean proteins, decreasing saturated fats and cholesterol , avoiding trans fats and exercise as able to promote weight loss, improve lipids and decrease cardiovascular risks. Recheck fasting lipid panel today.   Eating disorder/emotional eating/Cravings Avaclaire has had issues with stress/emotional eating. Currently this is moderately controlled. Overall mood is stable. Medication(s): Topiramate 25 mg nightly.  Reports consistent headache when taking topiramate consistently and has stopped due to HA.   Plan: Discontinue Topiramate and monitor closely  Work on emotional eating strategies to promote weight loss.     ATTESTASTION STATEMENTS:  Reviewed by clinician on day  of visit: allergies, medications, problem list, medical history, surgical history, family history, social history, and previous encounter notes.   I have personally spent 47 minutes total time today in preparation, patient care, nutritional counseling and documentation for this visit, including the following: review of clinical lab tests; review of medical tests/procedures/services.      Leili Eskenazi, PA-C

## 2023-02-13 ENCOUNTER — Telehealth: Payer: Self-pay | Admitting: Internal Medicine

## 2023-02-13 LAB — CMP14+EGFR
ALT: 20 [IU]/L (ref 0–32)
AST: 16 [IU]/L (ref 0–40)
Albumin: 3.9 g/dL (ref 3.9–4.9)
Alkaline Phosphatase: 85 [IU]/L (ref 44–121)
BUN/Creatinine Ratio: 12 (ref 9–23)
BUN: 9 mg/dL (ref 6–24)
Bilirubin Total: 1.1 mg/dL (ref 0.0–1.2)
CO2: 23 mmol/L (ref 20–29)
Calcium: 8.8 mg/dL (ref 8.7–10.2)
Chloride: 103 mmol/L (ref 96–106)
Creatinine, Ser: 0.78 mg/dL (ref 0.57–1.00)
Globulin, Total: 2.7 g/dL (ref 1.5–4.5)
Glucose: 89 mg/dL (ref 70–99)
Potassium: 4.3 mmol/L (ref 3.5–5.2)
Sodium: 140 mmol/L (ref 134–144)
Total Protein: 6.6 g/dL (ref 6.0–8.5)
eGFR: 93 mL/min/{1.73_m2} (ref >59)

## 2023-02-13 LAB — HEMOGLOBIN A1C
Est. average glucose Bld gHb Est-mCnc: 108 mg/dL
Hgb A1c MFr Bld: 5.4 % (ref 4.8–5.6)

## 2023-02-13 LAB — LIPID PANEL WITH LDL/HDL RATIO
Cholesterol, Total: 118 mg/dL (ref 100–199)
HDL: 32 mg/dL — ABNORMAL LOW (ref >39)
LDL Chol Calc (NIH): 65 mg/dL (ref 0–99)
LDL/HDL Ratio: 2 ratio (ref 0.0–3.2)
Triglycerides: 115 mg/dL (ref 0–149)
VLDL Cholesterol Cal: 21 mg/dL (ref 5–40)

## 2023-02-13 LAB — CBC WITH DIFFERENTIAL/PLATELET
Basophils Absolute: 0 10*3/uL (ref 0.0–0.2)
Basos: 0 %
EOS (ABSOLUTE): 0.1 10*3/uL (ref 0.0–0.4)
Eos: 1 %
Hematocrit: 42.2 % (ref 34.0–46.6)
Hemoglobin: 13.1 g/dL (ref 11.1–15.9)
Immature Grans (Abs): 0 10*3/uL (ref 0.0–0.1)
Immature Granulocytes: 0 %
Lymphocytes Absolute: 1.9 10*3/uL (ref 0.7–3.1)
Lymphs: 26 %
MCH: 30.5 pg (ref 26.6–33.0)
MCHC: 31 g/dL — ABNORMAL LOW (ref 31.5–35.7)
MCV: 98 fL — ABNORMAL HIGH (ref 79–97)
Monocytes Absolute: 0.5 10*3/uL (ref 0.1–0.9)
Monocytes: 6 %
Neutrophils Absolute: 4.9 10*3/uL (ref 1.4–7.0)
Neutrophils: 67 %
Platelets: 256 10*3/uL (ref 150–450)
RBC: 4.3 x10E6/uL (ref 3.77–5.28)
RDW: 13.2 % (ref 11.7–15.4)
WBC: 7.3 10*3/uL (ref 3.4–10.8)

## 2023-02-13 LAB — INSULIN, RANDOM: INSULIN: 21.8 u[IU]/mL (ref 2.6–24.9)

## 2023-02-13 LAB — VITAMIN D 25 HYDROXY (VIT D DEFICIENCY, FRACTURES): Vit D, 25-Hydroxy: 56.9 ng/mL (ref 30.0–100.0)

## 2023-02-13 LAB — VITAMIN B12: Vitamin B-12: 372 pg/mL (ref 232–1245)

## 2023-02-13 MED ORDER — ROSUVASTATIN CALCIUM 5 MG PO TABS: 5.0000 mg | ORAL_TABLET | Freq: Every day | ORAL | 0 refills | Status: DC

## 2023-02-13 NOTE — Telephone Encounter (Signed)
Arieon Dolder [phone]  [name] called to say she needs a refill on her statin medication and she had labs done somewhere else. I let her know she still needed her CPE here because we had not seen her for it since last October, so I went ahead and scheduled her for 11/8/224. Did not schedule her for labs, looks like she did have most of them recently and they were fasting.  She only has 1 of the below medications left rosuvastatin (CRESTOR) 5 MG tablet

## 2023-02-14 NOTE — Progress Notes (Signed)
Patient Care Team: Margaree Mackintosh, MD as PCP - General (Internal Medicine)  Visit Date: 02/21/23  Subjective:    Patient ID: Kara Wilkinson , Female   DOB: [birth year], 47 y.o.    MRN: [id number]   47 y.o. Female presents today for annual comprehensive physical exam. History of anemia, chronic headache, hyperlipidemia, Meniere's disease, sleep apnea.  History of hyperlipidemia treated with rosuvastatin 5 mg daily. HDL low at 32.  History of vertigo treated with alprazolam 0.25 mg twice daily as needed.Vertigo likely due to Meniere's dissease.  History of Vitamin D Deficiency treated with Drisdol 50,000 units weekly. Vitamin D at 56.9. She feels better after starting high-dose Vitamin D.  Continues to be seen at [facility].  Amoxicillin causes hives.  Nickel causes a rash.   No pregnancies.  No history of accidents or operations.  No hospitalizations.   Patient has seen Dr. Suszanne Conners, ENT physician and has been diagnosed with Mnire's disease.   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  Has been seen mostly for respiratory infections through the years.  Seen for vasovagal syncope in April 1998.  Fractured left index finger and 1990.  She had a fall in January 2019 injuring her right ankle and right index finger.  Was seen in the emergency department.  She has a Mirena device and sees GYN.    She has glucose intolerance. Currently Hgb AIC is 6%. Continue with diet,exercise and weight loss. Could be a candidate for GLP-1 medication with her medical issues if she desires.  She had sleep study in 2016 interpreted by Dr. Vassie Loll and was found to have severe obstructive sleep apnea.  She did have some PVCs on EKG.  She now uses a CPAP, which helps significantly.   She had COVID-19 in February 2021.  Had COVID-19 again in August 2022.  In December 2021 she presented with benign positional vertigo.  She had vertigo in February  2020 with an acute otitis media.  At that time was treated with Zithromax and Xanax.  Labs reviewed today. Glucose normal. Kidney, liver functions normal. Electrolytes normal. Blood proteins normal. MCV elevated at 98. MCHC low at 31. HGBA1c at 5.4%. Vitamin B12 at 372.  Pap smear and mammogram last completed 04/03/21.  Colonoscopy last completed 04/22/22. Showed one 3 mm polyp in transverse colon, diverticulosis in sigmoid colon, non-bleeding internal hemorrhoids. Pathology showed tubular adenoma. Repeat recommended in 2031.  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  She is married and works for [employer].  Does not smoke or consume alcohol.  Husband has undergone kidney and pancreas transplant.   Family history: Father with history of end-stage renal disease, diabetes, macular degeneration and stroke.  Mother in good health.  1 sister with history of lupus and narcolepsy.  Another sister with history of arthritis.  Past Medical History:  Diagnosis Date   Anemia    Bilateral swelling of feet    Chronic headache    High cholesterol    Hyperlipidemia    Meniere's disease    Multiple food allergies    Sleep apnea    wear c pap   Sleep apnea      Family History  Problem Relation Age of Onset   Cancer Mother    Stroke Mother    Hyperlipidemia Mother    Hypertension Mother    Breast cancer Mother    Colon polyps Mother  Depression Mother    Sleep apnea Mother    Obesity Mother    Obesity Father    Sleep apnea Father    Cancer Father    Stroke Father    Hyperlipidemia Father    Hypertension Father    Colon polyps Father    Kidney disease Father    Diabetes Father    Bladder Cancer Father    Heart disease Father    Colon cancer Neg Hx    Crohn's disease Neg Hx    Esophageal cancer Neg Hx    Rectal cancer Neg Hx    Stomach cancer Neg Hx    Ulcerative colitis Neg Hx          Review of Systems  Constitutional:  Negative for chills, fever,  malaise/fatigue and weight loss.  HENT:  Negative for hearing loss, sinus pain and sore throat.   Respiratory:  Negative for cough, hemoptysis and shortness of breath.   Cardiovascular:  Negative for chest pain, palpitations, leg swelling and PND.  Gastrointestinal:  Negative for abdominal pain, constipation, diarrhea, heartburn, nausea and vomiting.  Genitourinary:  Negative for dysuria, frequency and urgency.  Musculoskeletal:  Negative for back pain, myalgias and neck pain.  Skin:  Negative for itching and rash.  Neurological:  Negative for dizziness, tingling, seizures and headaches.  Endo/Heme/Allergies:  Negative for polydipsia.  Psychiatric/Behavioral:  Negative for depression. The patient is not nervous/anxious.         Objective:   Vitals: BP 120/80   Pulse 67   Ht 5\' 5"  (1.651 m)   Wt 264 lb (119.7 kg)   SpO2 97%   BMI 43.93 kg/m    Physical Exam Vitals and nursing note reviewed.  Constitutional:      General: She is not in acute distress.    Appearance: Normal appearance. She is not ill-appearing or toxic-appearing.  HENT:     Head: Normocephalic and atraumatic.     Right Ear: Hearing, tympanic membrane, ear canal and external ear normal.     Left Ear: Hearing, tympanic membrane, ear canal and external ear normal.     Mouth/Throat:     Pharynx: Oropharynx is clear.  Eyes:     Extraocular Movements: Extraocular movements intact.     Pupils: Pupils are equal, round, and reactive to light.  Neck:     Thyroid: No thyroid mass, thyromegaly or thyroid tenderness.     Vascular: No carotid bruit.  Cardiovascular:     Rate and Rhythm: Normal rate and regular rhythm. No extrasystoles are present.    Heart sounds: Normal heart sounds. No murmur heard.    No friction rub. No gallop.     Comments: Isolated unifocal PVCs. Pulmonary:     Effort: Pulmonary effort is normal.     Breath sounds: Normal breath sounds. No decreased breath sounds, wheezing, rhonchi or rales.   Chest:     Chest wall: No mass.  Abdominal:     Palpations: Abdomen is soft. There is no hepatomegaly, splenomegaly or mass.     Tenderness: There is no abdominal tenderness.     Hernia: No hernia is present.  Musculoskeletal:     Cervical back: Normal range of motion.     Right lower leg: No edema.     Left lower leg: No edema.  Lymphadenopathy:     Cervical: No cervical adenopathy.     Upper Body:     Right upper body: No supraclavicular adenopathy.     Left  upper body: No supraclavicular adenopathy.  Skin:    General: Skin is warm and dry.  Neurological:     General: No focal deficit present.     Mental Status: She is alert and oriented to person, place, and time. Mental status is at baseline.     Sensory: Sensation is intact.     Motor: Motor function is intact. No weakness.     Deep Tendon Reflexes: Reflexes are normal and symmetric.  Psychiatric:        Attention and Perception: Attention normal.        Mood and Affect: Mood normal.        Speech: Speech normal.        Behavior: Behavior normal.        Thought Content: Thought content normal.        Cognition and Memory: Cognition normal.        Judgment: Judgment normal.      Results:   Studies obtained and personally reviewed by me:  Pap smear and mammogram last completed 04/03/21.  Colonoscopy last completed 04/22/22. Showed one 3 mm polyp in transverse colon, diverticulosis in sigmoid colon, non-bleeding internal hemorrhoids. Pathology showed tubular adenoma. Repeat recommended in 2031.  Labs:       Component Value Date/Time   NA 140 02/12/2023 0758   K 4.3 02/12/2023 0758   CL 103 02/12/2023 0758   CO2 23 02/12/2023 0758   GLUCOSE 89 02/12/2023 0758   GLUCOSE 78 07/25/2022 1322   BUN 9 02/12/2023 0758   CREATININE 0.78 02/12/2023 0758   CREATININE 0.77 07/23/2022 1236   CALCIUM 8.8 02/12/2023 0758   PROT 6.6 02/12/2023 0758   ALBUMIN 3.9 02/12/2023 0758   AST 16 02/12/2023 0758   ALT 20 02/12/2023  0758   ALKPHOS 85 02/12/2023 0758   BILITOT 1.1 02/12/2023 0758   GFRNONAA >60 07/25/2022 1322     Lab Results  Component Value Date   WBC 7.3 02/12/2023   HGB 13.1 02/12/2023   HCT 42.2 02/12/2023   MCV 98 (H) 02/12/2023   PLT 256 02/12/2023    Lab Results  Component Value Date   CHOL 118 02/12/2023   HDL 32 (L) 02/12/2023   LDLCALC 65 02/12/2023   TRIG 115 02/12/2023   CHOLHDL 6.1 (H) 01/31/2022    Lab Results  Component Value Date   HGBA1C 5.4 02/12/2023     Lab Results  Component Value Date   TSH 2.34 07/23/2022      Assessment & Plan:   Hyperlipidemia: treated with rosuvastatin 5 mg daily. Refilled. HDL low at 32. HDL would improve with exercise.  Vertigo: treated with alprazolam 0.25 mg twice daily as needed.  Vitamin D Deficiency: treated with Drisdol 50,000 units weekly. Refilled. Vitamin D at 56.9.   PVC: ordered 3-day Zio monitor.  OSA: uses CPAP.  Urinalysis showed moderate blood, moderate leukocytes. Urine culture ordered.  Breast and pelvic exam deferred to GYN physician.  Pap smear and mammogram last completed 04/03/21.  Colonoscopy last completed 04/22/22. Showed one 3 mm polyp in transverse colon, diverticulosis in sigmoid colon, non-bleeding internal hemorrhoids. Pathology showed tubular adenoma. Repeat recommended in 2031.  Vaccine counseling: UTD on tetanus vaccine. Administered flu vaccine. Declines Covid-19 vaccine.  Return in 1 year or as needed. Zio monitor ordered    I,Alexander Ruley,acting as a scribe for Margaree Mackintosh, MD.,have documented all relevant documentation on the behalf of Margaree Mackintosh, MD,as directed by  Margaree Mackintosh, MD  while in the presence of Margaree Mackintosh, MD.   I, Margaree Mackintosh, MD, have reviewed all documentation for this visit. The documentation on 03/08/23 for the exam, diagnosis, procedures, and orders are all accurate and complete.

## 2023-02-21 ENCOUNTER — Ambulatory Visit: Payer: 59 | Admitting: Internal Medicine

## 2023-02-21 ENCOUNTER — Encounter: Payer: Self-pay | Admitting: Internal Medicine

## 2023-02-21 VITALS — BP 120/80 | HR 67 | Ht 65.0 in | Wt 264.0 lb

## 2023-02-21 DIAGNOSIS — Z23 Encounter for immunization: Secondary | ICD-10-CM

## 2023-02-21 DIAGNOSIS — Z8249 Family history of ischemic heart disease and other diseases of the circulatory system: Secondary | ICD-10-CM

## 2023-02-21 DIAGNOSIS — E7800 Pure hypercholesterolemia, unspecified: Secondary | ICD-10-CM

## 2023-02-21 DIAGNOSIS — H8109 Meniere's disease, unspecified ear: Secondary | ICD-10-CM

## 2023-02-21 DIAGNOSIS — G4733 Obstructive sleep apnea (adult) (pediatric): Secondary | ICD-10-CM

## 2023-02-21 DIAGNOSIS — Z0000 Encounter for general adult medical examination without abnormal findings: Secondary | ICD-10-CM | POA: Diagnosis not present

## 2023-02-21 DIAGNOSIS — I493 Ventricular premature depolarization: Secondary | ICD-10-CM | POA: Diagnosis not present

## 2023-02-21 DIAGNOSIS — Z6841 Body Mass Index (BMI) 40.0 and over, adult: Secondary | ICD-10-CM

## 2023-02-21 DIAGNOSIS — R82998 Other abnormal findings in urine: Secondary | ICD-10-CM | POA: Diagnosis not present

## 2023-02-21 DIAGNOSIS — R8290 Unspecified abnormal findings in urine: Secondary | ICD-10-CM

## 2023-02-21 DIAGNOSIS — E559 Vitamin D deficiency, unspecified: Secondary | ICD-10-CM

## 2023-02-21 DIAGNOSIS — R7302 Impaired glucose tolerance (oral): Secondary | ICD-10-CM

## 2023-02-21 DIAGNOSIS — E786 Lipoprotein deficiency: Secondary | ICD-10-CM

## 2023-02-21 LAB — POCT URINALYSIS DIP (CLINITEK)
Bilirubin, UA: NEGATIVE
Glucose, UA: NEGATIVE mg/dL
Ketones, POC UA: NEGATIVE mg/dL
Nitrite, UA: NEGATIVE
POC PROTEIN,UA: NEGATIVE
Spec Grav, UA: 1.01 (ref 1.010–1.025)
Urobilinogen, UA: 0.2 U/dL
pH, UA: 7 (ref 5.0–8.0)

## 2023-02-21 MED ORDER — ERGOCALCIFEROL 1.25 MG (50000 UT) PO CAPS: 50000.0000 [IU] | ORAL_CAPSULE | ORAL | 3 refills | Status: DC

## 2023-02-21 MED ORDER — ROSUVASTATIN CALCIUM 5 MG PO TABS: 5.0000 mg | ORAL_TABLET | Freq: Every day | ORAL | 3 refills | Status: DC

## 2023-02-22 LAB — URINE CULTURE
MICRO NUMBER:: 15705817
SPECIMEN QUALITY:: ADEQUATE

## 2023-03-03 ENCOUNTER — Encounter: Payer: Self-pay | Admitting: Adult Health

## 2023-03-03 ENCOUNTER — Ambulatory Visit (INDEPENDENT_AMBULATORY_CARE_PROVIDER_SITE_OTHER): Payer: 59 | Admitting: Adult Health

## 2023-03-03 VITALS — BP 124/80 | HR 61 | Ht 65.0 in | Wt 263.8 lb

## 2023-03-03 DIAGNOSIS — G4733 Obstructive sleep apnea (adult) (pediatric): Secondary | ICD-10-CM

## 2023-03-03 DIAGNOSIS — Z6841 Body Mass Index (BMI) 40.0 and over, adult: Secondary | ICD-10-CM | POA: Diagnosis not present

## 2023-03-03 NOTE — Patient Instructions (Addendum)
Keep up good work  Wear CPAP At bedtime   Do not drive if sleepy  Work on healthy weight loss  Follow up in 1 year with Dr. Vassie Loll or Ysela Hettinger NP and As needed

## 2023-03-03 NOTE — Progress Notes (Signed)
@Patient  ID: Kara Wilkinson, female    DOB: 11-01-1975, 47 y.o.   MRN: [id number]  Chief Complaint  Patient presents with   Follow-up    Referring provider: Margaree Mackintosh, MD  HPI: 47 year old female followed for sleep apnea  TEST/EVENTS :  Split-night sleep study November 20, 2014 showed severe obstructive sleep apnea with AHI at 108.7/hour with an optimal CPAP pressure at 10 cm H2O.   New CPAP machine 2023  03/03/2023 Follow up : OSA  Patient presents for 1 year follow-up.  Patient has severe obstructive sleep apnea.  She remains on CPAP at bedtime.  Patient says she is doing well on CPAP.  Cannot sleep without it.  Feels that she benefits from CPAP with decreased daytime sleepiness.  CPAP download shows 100% compliance.  Daily average usage at 8 hours.  Patient is on CPAP 10 cm H2O.  AHI 1.9/hour. Is currently working on weight loss.  Weight is down 25 pounds. We did discuss INSPIRE device briefly.   Allergies  Allergen Reactions   Amoxicillin Hives   Avocado Diarrhea   Caffeine     And artificial sweetners    Immunization History  Administered Date(s) Administered   Influenza, Seasonal, Injecte, Preservative Fre 02/21/2023   Influenza,inj,Quad PF,6+ Mos 03/19/2019, 02/04/2022   Influenza-Unspecified 03/15/2014   PFIZER(Purple Top)SARS-COV-2 Vaccination 07/24/2019, 08/25/2019   Tdap 05/19/2014    Past Medical History:  Diagnosis Date   Anemia    Bilateral swelling of feet    Chronic headache    High cholesterol    Hyperlipidemia    Meniere's disease    Multiple food allergies    Sleep apnea    wear c pap   Sleep apnea     Tobacco History: Social History   Tobacco Use  Smoking Status Never   Passive exposure: Past (father smoked)  Smokeless Tobacco Never   Counseling given: Not Answered   Outpatient Medications Prior to Visit  Medication Sig Dispense Refill   ALPRAZolam (XANAX) 0.25 MG tablet TAKE 1 TABLET BY MOUTH 2 TIMES DAILY AS NEEDED FOR  vertigo 60 tablet 1   ergocalciferol (DRISDOL) 1.25 MG (50000 UT) capsule Take 1 capsule (50,000 Units total) by mouth once a week. 12.857 capsule 3   levonorgestrel (MIRENA) 20 MCG/24HR IUD 1 each by Intrauterine route once.     rosuvastatin (CRESTOR) 5 MG tablet Take 1 tablet (5 mg total) by mouth daily. 90 tablet 3   No facility-administered medications prior to visit.     Review of Systems:   Constitutional:   No  weight loss, night sweats,  Fevers, chills, fatigue, or  lassitude.  HEENT:   No headaches,  Difficulty swallowing,  Tooth/dental problems, or  Sore throat,                No sneezing, itching, ear ache, nasal congestion, post nasal drip,   CV:  No chest pain,  Orthopnea, PND, swelling in lower extremities, anasarca, dizziness, palpitations, syncope.   GI  No heartburn, indigestion, abdominal pain, nausea, vomiting, diarrhea, change in bowel habits, loss of appetite, bloody stools.   Resp: No shortness of breath with exertion or at rest.  No excess mucus, no productive cough,  No non-productive cough,  No coughing up of blood.  No change in color of mucus.  No wheezing.  No chest wall deformity  Skin: no rash or lesions.  GU: no dysuria, change in color of urine, no urgency or frequency.  No flank pain,  no hematuria   MS:  No joint pain or swelling.  No decreased range of motion.  No back pain.    Physical Exam  BP 124/80 (BP Location: Left Arm, Cuff Size: Large)   Pulse 61   Ht 5\' 5"  (1.651 m)   Wt 263 lb 12.8 oz (119.7 kg)   SpO2 98%   BMI 43.90 kg/m   GEN: A/Ox3; pleasant , NAD, well nourished    HEENT:  [place]/AT,   NOSE-clear, THROAT-clear, no lesions, no postnasal drip or exudate noted.  Class 3 MP airway   NECK:  Supple w/ fair ROM; no JVD; normal carotid impulses w/o bruits; no thyromegaly or nodules palpated; no lymphadenopathy.    RESP  Clear  P & A; w/o, wheezes/ rales/ or rhonchi. no accessory muscle use, no dullness to percussion  CARD:  RRR, no  m/r/g, no peripheral edema, pulses intact, no cyanosis or clubbing.  GI:   Soft & nt; nml bowel sounds; no organomegaly or masses detected.   Musco: Warm bil, no deformities or joint swelling noted.   Neuro: alert, no focal deficits noted.    Skin: Warm, no lesions or rashes    Lab Results:  CBC  BNP No results found for: "BNP"  ProBNP No results found for: "PROBNP"  Imaging: No results found.  Administration History     None           No data to display          No results found for: "NITRICOXIDE"      Assessment & Plan:   OSA (obstructive sleep apnea) Excellent control and compliance on CPAP   Plan  Patient Instructions  Keep up good work  Wear CPAP At bedtime   Do not drive if sleepy  Work on healthy weight loss  Follow up in 1 year with Dr. Vassie Loll or Galan Ghee NP and As needed      BMI 40.0-44.9, adult (HCC) Healthy weight loss discussed     Rubye Oaks, NP 03/03/2023

## 2023-03-03 NOTE — Assessment & Plan Note (Signed)
Healthy weight loss discussed 

## 2023-03-03 NOTE — Assessment & Plan Note (Signed)
Excellent control and compliance on CPAP   Plan  Patient Instructions  Keep up good work  Wear CPAP At bedtime   Do not drive if sleepy  Work on healthy weight loss  Follow up in 1 year with Dr. Vassie Loll or Cleto Claggett NP and As needed

## 2023-03-05 ENCOUNTER — Encounter (INDEPENDENT_AMBULATORY_CARE_PROVIDER_SITE_OTHER): Payer: Self-pay | Admitting: Physician Assistant

## 2023-03-05 ENCOUNTER — Ambulatory Visit (INDEPENDENT_AMBULATORY_CARE_PROVIDER_SITE_OTHER): Payer: 59 | Admitting: Physician Assistant

## 2023-03-05 VITALS — BP 125/84 | HR 66 | Temp 98.5°F | Ht 65.0 in | Wt 260.0 lb

## 2023-03-05 DIAGNOSIS — E786 Lipoprotein deficiency: Secondary | ICD-10-CM | POA: Diagnosis not present

## 2023-03-05 DIAGNOSIS — Z6830 Body mass index (BMI) 30.0-30.9, adult: Secondary | ICD-10-CM | POA: Insufficient documentation

## 2023-03-05 DIAGNOSIS — E7849 Other hyperlipidemia: Secondary | ICD-10-CM

## 2023-03-05 DIAGNOSIS — R7303 Prediabetes: Secondary | ICD-10-CM | POA: Diagnosis not present

## 2023-03-05 DIAGNOSIS — E559 Vitamin D deficiency, unspecified: Secondary | ICD-10-CM

## 2023-03-05 DIAGNOSIS — Z6841 Body Mass Index (BMI) 40.0 and over, adult: Secondary | ICD-10-CM

## 2023-03-05 NOTE — Progress Notes (Signed)
SUBJECTIVE:  Chief Complaint: Obesity  Interim History: Terrian Libbey, a 47 year old female with a history of prediabetes, vitamin D deficiency, low HDL cholesterol, and a family history of heart disease, presents for a follow-up visit regarding her obesity treatment plan.  Recent weight loss of three pounds, which she attributes to overall good adherence to her diet plan, despite a brief indulgence in Halloween candy. She has been able to maintain her muscle mass and reduce adipose tissue.  She also reports that her primary care physician was pleased with her recent lab results. However, she expresses concern about the upcoming holiday season, particularly Thanksgiving, and the potential for dietary lapses.  She acknowledges the need for portion control and expresses a willingness to implement strategies such as waiting between portions and walking within 30 minutes of eating to prevent glucose spikes.  She also mentions the recent anniversary of her mother-in-law's passing, which has led to an increase in comfort foods in her home environment. She expresses the need to be mindful of these potential triggers and to remove them from her immediate environment.  She also discusses the dietary restrictions of her spouse, who has had a kidney transplant and is currently dealing with high potassium levels. This has led to changes in their shared meals, with a focus on lean meats and non-starchy vegetables. She expresses a willingness to continue with these dietary changes despite the challenges. Her husband is also going to have surgery in January in [place] and she will need to stay with him during his recovery.   She is scheduled for a follow-up visit in six weeks to monitor her progress.  Juwanna is here to discuss her progress with her obesity treatment plan. She is on the Category 3 Plan and states she is following her eating plan approximately 50 % of the time. She states she is not  exercising 0 minutes 0 times per week.  Pharmacotherapy: None currently for weight loss    Topamax discontinued due to headaches, unfortunately it was helpful but unable to tolerate OBJECTIVE: Visit Diagnoses: Problem List Items Addressed This Visit     Morbid obesity (HCC)   Vitamin D deficiency   Prediabetes - Primary   Low HDL (under 40)   Other hyperlipidemia  Obesity Following up for obesity management. Progress noted with increased muscle mass and reduced adipose tissue.  Discussed strategies for managing diet during upcoming holidays, including portion control, mindful eating, and protein intake.  Emphasized walking within 30 minutes of meals to prevent glucose spikes and using walking videos for indoor exercise. Shared outcome statistics showing mindful eating can reduce calorie intake by a third. - Continue current nutrition plan and exercise regimen considering starting some walking videos to increase activity level. - Implement portion control strategies during Thanksgiving - Walk within 30 minutes of meals - Use walking videos for indoor exercise - Follow up on April 21, 2023, at 8 AM  Prediabetes Lab Results  Component Value Date   HGBA1C 5.4 02/12/2023   HGBA1C 6.0 (H) 09/11/2022   Lab Results  Component Value Date   LDLCALC 65 02/12/2023   CREATININE 0.78 02/12/2023  No medications for prediabetes management.  She is working on a  nutrition plan to decrease simple carbohydrates, increase lean proteins and exercise to promote weight loss, improve glycemic control and prevent progression to Type 2 diabetes.  Plan: Discussed importance of managing glucose levels through diet and exercise. Recommended walking post-meal to prevent glucose spikes. - Walk within 30 minutes  of meals Continue working on nutrition plan to decrease simple carbohydrates, increase lean proteins and exercise to promote weight loss, improve glycemic control and prevent progression to Type 2  diabetes.    Low HDL and family history of CAD LDL is at goal. Medication(s): Crestor 5 mg daily Cardiovascular risk factors: dyslipidemia, family history of premature cardiovascular disease, obesity (BMI >= 30 kg/m2), and sedentary lifestyle  Lab Results  Component Value Date   CHOL 118 02/12/2023   HDL 32 (L) 02/12/2023   LDLCALC 65 02/12/2023   TRIG 115 02/12/2023   CHOLHDL 6.1 (H) 01/31/2022   CHOLHDL 5.4 05/17/2014   Lab Results  Component Value Date   ALT 20 02/12/2023   AST 16 02/12/2023   ALKPHOS 85 02/12/2023   BILITOT 1.1 02/12/2023   The ASCVD Risk score (Arnett DK, et al., 2019) failed to calculate for the following reasons:   The valid total cholesterol range is 130 to 320 mg/dL  Plan: Continue statin. Continue to work on nutrition plan -decreasing simple carbohydrates, increasing lean proteins, decreasing saturated fats and cholesterol , avoiding trans fats and exercise as able to promote weight loss, improve lipids and decrease cardiovascular risks. Discussed importance of maintaining a healthy diet and regular exercise to mitigate risk factors.   General Health Maintenance Discussed regular exercise and mindful eating habits. Emphasized avoiding high-calorie snacks in the home. - Continue regular exercise - Implement mindful eating strategies - Avoid high-calorie snacks at home  Follow-up - Follow up on April 21, 2023, at 8 AM.  Vitals Temp: 98.5 F (36.9 C) BP: 125/84 Pulse Rate: 66 SpO2: 98 %   Anthropometric Measurements Height: 5\' 5"  (1.651 m) Weight: 260 lb (117.9 kg) BMI (Calculated): 43.27 Weight at Last Visit: 263 lb Weight Lost Since Last Visit: 3 lb Weight Gained Since Last Visit: 0 Starting Weight: 287 lb Total Weight Loss (lbs): 27 lb (12.2 kg) Peak Weight: 287 lb   Body Composition  Body Fat %: 51.4 % Fat Mass (lbs): 133.8 lbs Muscle Mass (lbs): 120.2 lbs Total Body Water (lbs): 99.2 lbs Visceral Fat Rating :  16   Other Clinical Data Fasting: yes Labs: no Today's Visit #: 9 Starting Date: 09/11/22     ASSESSMENT AND PLAN:  Diet: Evalyn is currently in the action stage of change. As such, her goal is to continue with weight loss efforts. She has agreed to Category 3 Plan.  Exercise: Lianne has been instructed that some exercise is better than none for weight loss and overall health benefits.   Behavior Modification:  We discussed the following Behavioral Modification Strategies today: increasing lean protein intake, decreasing simple carbohydrates, increasing vegetables, increase H2O intake, increase high fiber foods, meal planning and cooking strategies, holiday eating strategies, avoiding temptations, and decrease junk food . We discussed various medication options to help Nahia with her weight loss efforts and we both agreed to continue to work on nutritional and behavioral strategies to promote weight loss.  .  Return in about 4 weeks (around 04/02/2023).Marland Kitchen She was informed of the importance of frequent follow up visits to maximize her success with intensive lifestyle modifications for her multiple health conditions.  Attestation Statements:   Reviewed by clinician on day of visit: allergies, medications, problem list, medical history, surgical history, family history, social history, and previous encounter notes.   Time spent on visit including pre-visit chart review and post-visit care and charting was 35 minutes.    Kashmir Lysaght, PA-C

## 2023-03-08 NOTE — Patient Instructions (Addendum)
A 3-day ZIO monitor has been ordered for intermittent ectopic beats heard.  May take alprazolam up to twice daily as needed for vertigo episodes.  Please continue rosuvastatin 5 mg daily.  Continue CPAP.  Continue high-dose vitamin D weekly.  Please continue follow-up with GYN physician.  Flu vaccine given today.

## 2023-03-27 ENCOUNTER — Ambulatory Visit (INDEPENDENT_AMBULATORY_CARE_PROVIDER_SITE_OTHER): Payer: 59 | Admitting: Physician Assistant

## 2023-03-27 ENCOUNTER — Encounter (INDEPENDENT_AMBULATORY_CARE_PROVIDER_SITE_OTHER): Payer: Self-pay | Admitting: Physician Assistant

## 2023-03-27 VITALS — BP 133/68 | HR 63 | Temp 98.3°F | Ht 65.0 in | Wt 260.0 lb

## 2023-03-27 DIAGNOSIS — R7303 Prediabetes: Secondary | ICD-10-CM | POA: Diagnosis not present

## 2023-03-27 DIAGNOSIS — E7849 Other hyperlipidemia: Secondary | ICD-10-CM | POA: Diagnosis not present

## 2023-03-27 DIAGNOSIS — E559 Vitamin D deficiency, unspecified: Secondary | ICD-10-CM | POA: Diagnosis not present

## 2023-03-27 DIAGNOSIS — Z6841 Body Mass Index (BMI) 40.0 and over, adult: Secondary | ICD-10-CM

## 2023-03-27 DIAGNOSIS — F4321 Adjustment disorder with depressed mood: Secondary | ICD-10-CM

## 2023-03-27 NOTE — Progress Notes (Signed)
SUBJECTIVE:  Chief Complaint: Obesity  Interim History: She has maintained her weight loss since last visit Down 27 lbs overall TBW loss of 9.41% Kara Wilkinson, a 47 year old individual with a history of obesity, prediabetes, vitamin D deficiency, and hyperlipidemia, presents for a follow-up visit regarding her obesity treatment plan. She reports maintaining her weight over the Thanksgiving holiday, despite a stressful month due to work-related issues and a personal loss. She acknowledges resorting to comfort eating during this period, but also expresses a conscious effort to stop this behavior, recognizing its detrimental impact on her health.  She has been engaging in physical activity, specifically cycling, which has resulted in muscle gain and a decrease in adipose tissue, despite a neutral overall weight. She expresses a desire to continue this activity and maintain her physical health, especially during this emotionally challenging time.  She also reports joint aches, which she attributes to her current statin medication. She has attempted to manage her desire to overeat by taking topiramate, but had to discontinue due to severe headaches. She has been using a CPAP machine consistently for her severe sleep apnea, which has significantly improved her condition and reduced her need for candy to stay awake.  In addition to these health concerns, she is also dealing with the upcoming surgeries of her spouse, which adds to her current stress levels. Despite these challenges, she expresses a strong desire to lose weight and improve her overall health.  Kalyse is here to discuss her progress with her obesity treatment plan. She is on the Category 3 Plan and states she is following her eating plan approximately 50 % of the time. She states she is exercising stationary bike/walking 20 minutes 5 times per week.   OBJECTIVE: Visit Diagnoses: Problem List Items Addressed This Visit     Morbid  obesity (HCC)   Vitamin D deficiency   Prediabetes   Other hyperlipidemia   Grief - Primary  Obesity Follow-up for obesity management. Reports stress-related comfort eating but has been engaging in physical activity, specifically biking, resulting in muscle gain and adipose loss. Emphasized importance of maintaining physical health to support emotional well-being. Discussed alternative stress-relief strategies and importance of protein intake and portion control. - Continue biking and other physical activities - Focus on protein intake and portion control - Use alternative stress-relief strategies such as baths or journaling  Grief: Discussed strategies for maintaining health during stressful periods, including physical activity, stress management, and dietary habits. Encouraged use of Employee Assistance Program (EAP) for counseling and considering journaling as a stress-relief strategy. - Dispensing optician (EAP) for counseling - Consider journaling as a stress-relief strategy - Focus on self-care activities to help decrease her stress during this difficult time.    Hyperlipidemia Reports joint aches potentially related to statin use. Current lipid profile shows improved LDL and triglycerides but low HDL. Discussed possibility of adjusting statin dosage or frequency due to side effects. - Contact Dr. Lenord Fellers to discuss potential adjustments to statin therapy  Prediabetes Emphasis on maintaining a healthy diet and physical activity to manage weight and prevent progression to diabetes. Discussed importance of regular monitoring of blood glucose levels. - Continue current dietary and exercise regimen - Monitor blood glucose levels regularly  Sleep Apnea Severe sleep apnea managed with CPAP. Significant improvement in apnea episodes with CPAP use, reducing from 108 to 2-3 per hour. Reports consistent use and improved daytime functioning. - Continue using CPAP machine  consistently  Vitamin D Deficiency No specific discussion on  current status or treatment adjustments. - Continue current vitamin D supplementation as previously prescribed  Follow-up - Follow up on January 6th.  Vitals Temp: 98.3 F (36.8 C) BP: 133/68 Pulse Rate: 63 SpO2: 100 %   Anthropometric Measurements Height: 5\' 5"  (1.651 m) Weight: 260 lb (117.9 kg) BMI (Calculated): 43.27 Weight at Last Visit: 260 lb Weight Lost Since Last Visit: 0 Weight Gained Since Last Visit: 0 Starting Weight: 287 lb Total Weight Loss (lbs): 27 lb (12.2 kg) Peak Weight: 287 lb   Body Composition  Body Fat %: 50.8 % Fat Mass (lbs): 132.4 lbs Muscle Mass (lbs): 122 lbs Total Body Water (lbs): 96.2 lbs Visceral Fat Rating : 16   Other Clinical Data Fasting: yes Labs: no Today's Visit #: 10 Starting Date: 09/11/22     ASSESSMENT AND PLAN:  Diet: Xiao is currently in the action stage of change. As such, her goal is to continue with weight loss efforts. She has agreed to Category 3 Plan.  Exercise: Finnleigh has been instructed to work up to a goal of 150 minutes of combined cardio and strengthening exercise per week for weight loss and overall health benefits.   Behavior Modification:  We discussed the following Behavioral Modification Strategies today: increasing lean protein intake, decreasing simple carbohydrates, increasing vegetables, increase H2O intake, increase high fiber foods, emotional eating strategies , holiday eating strategies, and planning for success. We discussed various medication options to help Latoria with her weight loss efforts and we both agreed to continue to work on nutritional and behavioral strategies to promote weight loss.  .  Return in about 4 weeks (around 04/24/2023).Marland Kitchen She was informed of the importance of frequent follow up visits to maximize her success with intensive lifestyle modifications for her multiple health conditions.  Attestation  Statements:   Reviewed by clinician on day of visit: allergies, medications, problem list, medical history, surgical history, family history, social history, and previous encounter notes.   Time spent on visit including pre-visit chart review and post-visit care and charting was 35 minutes.    Evin Loiseau, PA-C

## 2023-04-04 ENCOUNTER — Telehealth: Payer: Self-pay | Admitting: Internal Medicine

## 2023-04-04 NOTE — Telephone Encounter (Signed)
Copied from CRM [id number]. Topic: Clinical - Prescription Issue >> Apr 04, 2023  2:48 PM [name] T wrote: Reason for CRM: Patient thinks rosuvastatin (CRESTOR) 5 MG tablet is causing joint pain and would like to try something else if possible. Please call patient [phone]

## 2023-04-04 NOTE — Telephone Encounter (Signed)
Do we need to schedule an appointment to discuss changing medication?

## 2023-04-20 NOTE — Progress Notes (Deleted)
   SUBJECTIVE:  Chief Complaint: Obesity  Interim History: ***  Kara Wilkinson is here to discuss her progress with her obesity treatment plan. She is on the {HWW Weight Loss Plan:210964005} and states she {CHL AMB IS/IS NOT:210130109} following her eating plan approximately *** % of the time. She states she {CHL AMB IS/IS NOT:210130109} exercising *** minutes *** times per week.   OBJECTIVE: Visit Diagnoses: Problem List Items Addressed This Visit     Morbid obesity (HCC)   Vitamin D  deficiency   Prediabetes - Primary   Other hyperlipidemia   Grief    No data recorded No data recorded No data recorded No data recorded   ASSESSMENT AND PLAN:  Diet: Mila {CHL AMB IS/IS NOT:210130109} currently in the action stage of change. As such, her goal is to {HWW Weight Loss Efforts:210964006}. She {HAS HAS WNU:81165} agreed to {HWW Weight Loss Plan:210964005}.  Exercise: Candies has been instructed {HWW Exercise:210964007} for weight loss and overall health benefits.   Behavior Modification:  We discussed the following Behavioral Modification Strategies today: {HWW Behavior Modification:210964008}. We discussed various medication options to help Evoleht with her weight loss efforts and we both agreed to ***.  No follow-ups on file.Kara Wilkinson She was informed of the importance of frequent follow up visits to maximize her success with intensive lifestyle modifications for her multiple health conditions.  Attestation Statements:   Reviewed by clinician on day of visit: allergies, medications, problem list, medical history, surgical history, family history, social history, and previous encounter notes.   Time spent on visit including pre-visit chart review and post-visit care and charting was *** minutes.    Malajah Oceguera, PA-C

## 2023-04-21 ENCOUNTER — Ambulatory Visit (INDEPENDENT_AMBULATORY_CARE_PROVIDER_SITE_OTHER): Payer: 59 | Admitting: Physician Assistant

## 2023-04-21 ENCOUNTER — Encounter (INDEPENDENT_AMBULATORY_CARE_PROVIDER_SITE_OTHER): Payer: Self-pay

## 2023-04-21 DIAGNOSIS — F4321 Adjustment disorder with depressed mood: Secondary | ICD-10-CM

## 2023-04-21 DIAGNOSIS — R7303 Prediabetes: Secondary | ICD-10-CM

## 2023-04-21 DIAGNOSIS — E7849 Other hyperlipidemia: Secondary | ICD-10-CM

## 2023-04-21 DIAGNOSIS — E559 Vitamin D deficiency, unspecified: Secondary | ICD-10-CM

## 2023-05-07 NOTE — Progress Notes (Unsigned)
SUBJECTIVE: Discussed the use of AI scribe software for clinical note transcription with the patient, who gave verbal consent to proceed.  Chief Complaint: Obesity  Interim History: She is up 3 lbs since her last visit.  Kara Wilkinson, a 48 year old individual with a history of obesity, prediabetes, hyperlipidemia with low HDL under forty, and vitamin D deficiency, presents with complaints of fatigue and irregular sleep patterns. She reports feeling "draggy" and using snacks for temporary energy boosts, despite recognizing this as counterproductive. She also describes difficulty winding down to sleep, deviating from her usual regimented sleep schedule, and experiencing less sleep due to the same morning wake-up time.  The patient has been experiencing irregular menstrual cycles, with periods lasting longer than usual. This has been ongoing despite having a Mirena IUD in place, which was intended to regulate her menstrual cycle. She also reports increased thirst, which she attributes to the fiber capsules she has been taking to manage constipation.  In addition to these symptoms, the patient has been dealing with joint pain, which has improved somewhat with the use of CoQ10 as recommended by her doctor. She also reports stress related to job uncertainty and departmental changes at her workplace.  Despite these challenges, the patient has been making efforts to manage her health. She has been exercising consistently and maintaining her muscle mass, although she acknowledges some difficulty with dietary adherence. She expresses a desire to continue with her obesity treatment plan, fearing she may not return if she takes a break.  In summary, this is a 48 year old individual with a history of obesity, prediabetes, hyperlipidemia, and vitamin D deficiency, presenting with fatigue, irregular sleep patterns, prolonged menstrual periods, joint pain, and work-related stress. She is actively engaged in her  health management, with a focus on exercise and dietary control.  Kara Wilkinson is here to discuss her progress with her obesity treatment plan. She is on the Category 3 Plan and states she is following her eating plan approximately 60 % of the time. She states she is exercising stationary bike 60 minutes 7 times per week.  Pharmacotherapy: None currently for weight loss                                     Topamax discontinued due to headaches, unfortunately it was helpful but unable to tolerate  OBJECTIVE: Visit Diagnoses: Problem List Items Addressed This Visit     Morbid obesity (HCC)   Relevant Medications   Semaglutide-Weight Management (WEGOVY) 0.25 MG/0.5ML SOAJ   BMI 40.0-44.9, adult (HCC)   Relevant Medications   Semaglutide-Weight Management (WEGOVY) 0.25 MG/0.5ML SOAJ   Low HDL (under 40)   Relevant Medications   Semaglutide-Weight Management (WEGOVY) 0.25 MG/0.5ML SOAJ   Other hyperlipidemia - Primary   Relevant Medications   Semaglutide-Weight Management (WEGOVY) 0.25 MG/0.5ML SOAJ   Other Visit Diagnoses       Perimenopausal menorrhagia         Obesity Reports fatigue and disrupted sleep, potentially due to stress from work and personal life. Discussed Wegovy for weight loss and hyperlipidemia, including its effects on cravings and appetite control. Potential side effects include nausea, vomiting, constipation, and diarrhea. Emphasized hydration and smaller, protein-rich meals to mitigate side effects.  - Submit prior authorization for [facility] under hyperlipidemia diagnosis - Recommend magnesium supplement (Calm) to improve sleep - Suggest bedtime yoga routine to help wind down - Follow up in one month to monitor  progress and side effects  Hyperlipidemia/ HDL < 40 Low HDL. LDL improved to 65 on statin medication. On Crestor 5 mg daily. Very strong family history of CV disease/strokes in parents.  Discussed Wegovy, FDA-approved for hyperlipidemia, and emphasized  maintaining muscle mass and exercise. Potential side effects include nausea, vomiting, constipation, and diarrhea. Discussed hydration and smaller, protein-rich meals to mitigate side effects.  We have reviewed the risks and benefits of using a GLP-1. The patient denies a personal or family history of medullary thyroid cancer or MENII. The patient denies a history of pancreatitis. The potential risks and benefits of this GLP-1 were reviewed with the patient, and alternative treatment options were discussed. All questions were answered, and the patient wishes to move forward with this medication.   Alternative option of Zepbound if Reginal Lutes is not approved or causes side effects. - Submit prior authorization for [facility] under hyperlipidemia diagnosis - Continue current statin therapy Continue to work on nutrition plan -decreasing simple carbohydrates, increasing lean proteins, decreasing saturated fats and cholesterol , avoiding trans fats and exercise as able to promote weight loss, improve lipids and decrease cardiovascular risks. - Follow up in one month to monitor progress and side effects  Perimenopausal Symptoms Reports irregular periods and difficulty sleeping, likely due to perimenopausal hormonal changes. Discussed impact on well-being and energy levels. Recommended OB/GYN evaluation and potential iron supplementation if periods cause low iron levels. - Contact OB/GYN to discuss menstrual cycles and potential need for iron supplementation - Consider magnesium supplement (Calm) to improve sleep - Suggest bedtime yoga routine to help wind down  Joint Pain Improvement in joint pain with CoQ10 supplementation. Discussed potential benefits of magnesium for overall soreness and joint pain. - Continue CoQ10 supplementation - Consider magnesium supplement (Calm) to improve overall soreness and joint pain  General Health Maintenance Emphasized maintaining a healthy lifestyle, including regular  exercise and proper nutrition. - Continue regular exercise - Maintain a balanced diet - Follow up in one month to monitor progress and provide support  Follow-up - Follow up on Monday, February 17th at 12:00 PM.  Vitals Temp: 98.2 F (36.8 C) BP: (!) 136/90 Pulse Rate: 74 SpO2: 98 %   Anthropometric Measurements Height: 5\' 5"  (1.651 m) Weight: 263 lb (119.3 kg) BMI (Calculated): 43.77 Weight at Last Visit: 260 lb Weight Lost Since Last Visit: 0 Weight Gained Since Last Visit: 3 lb Starting Weight: 287 lb Total Weight Loss (lbs): 24 lb (10.9 kg) Peak Weight: 287 lb   Body Composition  Body Fat %: 51.7 % Fat Mass (lbs): 136.2 lbs Muscle Mass (lbs): 121 lbs Total Body Water (lbs): 98.2 lbs Visceral Fat Rating : 17   Other Clinical Data Fasting: no Labs: no Today's Visit #: 11 Starting Date: 09/11/22     ASSESSMENT AND PLAN:  Diet: Ceazia is currently in the action stage of change. As such, her goal is to continue with weight loss efforts. She has agreed to Category 3 Plan.  Exercise: Dyandra has been instructed to work up to a goal of 150 minutes of combined cardio and strengthening exercise per week for weight loss and overall health benefits.   Behavior Modification:  We discussed the following Behavioral Modification Strategies today: increasing lean protein intake, decreasing simple carbohydrates, increasing vegetables, increase H2O intake, increase high fiber foods, no skipping meals, meal planning and cooking strategies, better snacking choices, emotional eating strategies , avoiding temptations, and planning for success. We discussed various medication options to help Loren with her weight  loss efforts and we both agreed to start [facility] for primary indication of hyperlipidemia/dyslipidemia with very low HDL.  Return in about 4 weeks (around 06/05/2023).Marland Kitchen She was informed of the importance of frequent follow up visits to maximize her success with  intensive lifestyle modifications for her multiple health conditions.  Attestation Statements:   Reviewed by clinician on day of visit: allergies, medications, problem list, medical history, surgical history, family history, social history, and previous encounter notes.   Time spent on visit including pre-visit chart review and post-visit care and charting was 40 minutes.    Brenyn Petrey, PA-C

## 2023-05-08 ENCOUNTER — Encounter (INDEPENDENT_AMBULATORY_CARE_PROVIDER_SITE_OTHER): Payer: Self-pay | Admitting: Physician Assistant

## 2023-05-08 ENCOUNTER — Other Ambulatory Visit (INDEPENDENT_AMBULATORY_CARE_PROVIDER_SITE_OTHER): Payer: Self-pay | Admitting: Physician Assistant

## 2023-05-08 ENCOUNTER — Ambulatory Visit (INDEPENDENT_AMBULATORY_CARE_PROVIDER_SITE_OTHER): Payer: 59 | Admitting: Physician Assistant

## 2023-05-08 VITALS — BP 136/90 | HR 74 | Temp 98.2°F | Ht 65.0 in | Wt 263.0 lb

## 2023-05-08 DIAGNOSIS — Z6841 Body Mass Index (BMI) 40.0 and over, adult: Secondary | ICD-10-CM

## 2023-05-08 DIAGNOSIS — E7849 Other hyperlipidemia: Secondary | ICD-10-CM | POA: Diagnosis not present

## 2023-05-08 DIAGNOSIS — E786 Lipoprotein deficiency: Secondary | ICD-10-CM

## 2023-05-08 DIAGNOSIS — E559 Vitamin D deficiency, unspecified: Secondary | ICD-10-CM

## 2023-05-08 DIAGNOSIS — R7303 Prediabetes: Secondary | ICD-10-CM

## 2023-05-08 DIAGNOSIS — N924 Excessive bleeding in the premenopausal period: Secondary | ICD-10-CM

## 2023-05-08 DIAGNOSIS — F4321 Adjustment disorder with depressed mood: Secondary | ICD-10-CM

## 2023-05-08 MED ORDER — WEGOVY 0.25 MG/0.5ML ~~LOC~~ SOAJ
0.2500 mg | SUBCUTANEOUS | 0 refills | Status: DC
Start: 2023-05-08 — End: 2023-05-30

## 2023-05-20 ENCOUNTER — Telehealth (INDEPENDENT_AMBULATORY_CARE_PROVIDER_SITE_OTHER): Payer: Self-pay | Admitting: Otolaryngology

## 2023-05-20 NOTE — Telephone Encounter (Signed)
Reminder Call: Date: 05/21/2023 Status: Sch  Time: 1:20 PM [address] [place], [place] [address]  Confirmed time and location w/patient.

## 2023-05-21 ENCOUNTER — Encounter (INDEPENDENT_AMBULATORY_CARE_PROVIDER_SITE_OTHER): Payer: Self-pay

## 2023-05-21 ENCOUNTER — Ambulatory Visit (INDEPENDENT_AMBULATORY_CARE_PROVIDER_SITE_OTHER): Payer: 59

## 2023-05-21 VITALS — BP 148/70 | HR 66 | Ht 65.0 in | Wt 263.0 lb

## 2023-05-21 DIAGNOSIS — H903 Sensorineural hearing loss, bilateral: Secondary | ICD-10-CM

## 2023-05-21 DIAGNOSIS — H8112 Benign paroxysmal vertigo, left ear: Secondary | ICD-10-CM

## 2023-05-21 DIAGNOSIS — R42 Dizziness and giddiness: Secondary | ICD-10-CM

## 2023-05-21 NOTE — Progress Notes (Signed)
 Patient ID: Kara Wilkinson, female   DOB: 11-23-1975, 48 y.o.   MRN: [id number]  Follow-up: Hearing loss, recurrent dizziness, left ear Mnire's disease  HPI: The patient is a 48 year old female who returns today for her follow-up evaluation.  The patient has a history of recurrent dizziness, left ear Mnire's disease, and fluctuating hearing loss.  At her last visit 1 year ago, her left ear Mnire's disease was under control with a low-salt diet.  Mild bilateral high-frequency sensorineural hearing loss was noted at that time.  According to the patient, she was doing well until yesterday, when she had an acute dizziness episode.  She had a spinning vertigo that lasted for several minutes.  She was off balance for several hours.  She self treated with alprazolam .  Currently she denies any change in her hearing.  She denies any otalgia or otorrhea.  Exam: General: Communicates without difficulty, well nourished, no acute distress. Head: Normocephalic, no evidence injury, no tenderness, facial buttresses intact without stepoff. Face/sinus: No tenderness to palpation and percussion. Facial movement is normal and symmetric. Eyes: PERRL, EOMI. No scleral icterus, conjunctivae clear. Neuro: CN II exam reveals vision grossly intact.  No nystagmus at any point of gaze. Ears: Auricles well formed without lesions.  Ear canals are intact without mass or lesion.  No erythema or edema is appreciated.  The TMs are intact without fluid. Nose: External evaluation reveals normal support and skin without lesions.  Dorsum is intact.  Anterior rhinoscopy reveals pink mucosa over anterior aspect of inferior turbinates and intact septum.  No purulence noted. Oral:  Oral cavity and oropharynx are intact, symmetric, without erythema or edema.  Mucosa is moist without lesions. Neck: Full range of motion without pain.  There is no significant lymphadenopathy.  No masses palpable.  Thyroid bed within normal limits to palpation.   Parotid glands and submandibular glands equal bilaterally without mass.  Trachea is midline. Neuro:  CN 2-12 grossly intact. Vestibular: No nystagmus at any point of gaze. Vestibular: Dix-Hallpike produces rotational nystagmus with left-sided positioning. Vestibular: There is no nystagmus with pneumatic pressure on either tympanic membrane or Valsalva. The cerebellar examination is unremarkable.    Procedure: Left-sided Epley maneuver  Anesthesia: None Indication: To treat the left BPPV Desciption:  The patient is first placed in a left-sided Dix-Hallpike position. After the vertigo has subsided, the head is gradually rotated from the left to the right, completing a 180 turn. The patient is then returned to the upright position. The patient tolerated the procedure well without any difficulty.    Assessment 1.  The patient's left Dix-Hallpike maneuver is positive today, consistent with left BPPV. 2.  Her ear canals, tympanic membranes, and middle ear spaces are normal. 3.  The patient's left ear Meniere's disease is currently under control with the low-salt diet.  4.  Subjectively stable bilateral mild high-frequency sensorineural hearing loss.  Plan  1.  The physical exam findings are reviewed with the patient. 2.  The pathophysiology of BPPV and dizziness are discussed with the patient. Questions are invited and answered.  3.  The left Epley maneuver is performed today without difficulty. 4.  Post Epley activity restrictions are discussed. 5.  The patient should continue with her low-salt diet.  6.  The patient will return for re-evaluation in 1 month.

## 2023-05-22 DIAGNOSIS — R42 Dizziness and giddiness: Secondary | ICD-10-CM | POA: Insufficient documentation

## 2023-05-22 DIAGNOSIS — H8112 Benign paroxysmal vertigo, left ear: Secondary | ICD-10-CM | POA: Insufficient documentation

## 2023-05-22 DIAGNOSIS — H903 Sensorineural hearing loss, bilateral: Secondary | ICD-10-CM | POA: Insufficient documentation

## 2023-05-30 ENCOUNTER — Telehealth: Payer: Self-pay | Admitting: Internal Medicine

## 2023-05-30 ENCOUNTER — Other Ambulatory Visit: Payer: Self-pay | Admitting: Internal Medicine

## 2023-05-30 DIAGNOSIS — E786 Lipoprotein deficiency: Secondary | ICD-10-CM

## 2023-05-30 DIAGNOSIS — E7849 Other hyperlipidemia: Secondary | ICD-10-CM

## 2023-05-30 NOTE — Telephone Encounter (Signed)
Copied from CRM [id number]. Topic: Clinical - Medication Refill >> May 30, 2023  9:26 AM Geroge Baseman wrote: Most Recent Primary Care Visit:  Provider: Lazaro Arms [facility]  Department: [facility]  Visit Type: FOLLOW UP  Date: 05/08/2023  Medication: Semaglutide-Weight Management (WEGOVY) 0.25 MG/0.5ML SOAJ  Has the patient contacted their pharmacy? Yes Pharmacy is out of Wegovy  Is this the correct pharmacy for this prescription? Yes If no, delete pharmacy and type the correct one.  This is the patient's preferred pharmacy:   [id number] - [place], [place] - [address] AT [facility] [address] [place] [address] Phone: [phone] Fax: [phone]   Has the prescription been filled recently? No  Is the patient out of the medication? Yes, never had  Has the patient been seen for an appointment in the last year OR does the patient have an upcoming appointment? No  Can we respond through MyChart? No  Agent: Please be advised that Rx refills may take up to 3 business days. We ask that you follow-up with your pharmacy.

## 2023-06-01 MED ORDER — WEGOVY 0.25 MG/0.5ML ~~LOC~~ SOAJ
0.2500 mg | SUBCUTANEOUS | 0 refills | Status: DC
Start: 2023-06-01 — End: 2023-06-03

## 2023-06-02 ENCOUNTER — Other Ambulatory Visit: Payer: Self-pay | Admitting: Physician Assistant

## 2023-06-02 ENCOUNTER — Ambulatory Visit (INDEPENDENT_AMBULATORY_CARE_PROVIDER_SITE_OTHER): Payer: 59 | Admitting: Physician Assistant

## 2023-06-02 DIAGNOSIS — E7849 Other hyperlipidemia: Secondary | ICD-10-CM

## 2023-06-02 DIAGNOSIS — E786 Lipoprotein deficiency: Secondary | ICD-10-CM

## 2023-06-03 ENCOUNTER — Encounter (INDEPENDENT_AMBULATORY_CARE_PROVIDER_SITE_OTHER): Payer: Self-pay | Admitting: Physician Assistant

## 2023-06-03 ENCOUNTER — Telehealth: Payer: Self-pay | Admitting: Internal Medicine

## 2023-06-03 ENCOUNTER — Other Ambulatory Visit (INDEPENDENT_AMBULATORY_CARE_PROVIDER_SITE_OTHER): Payer: Self-pay | Admitting: Physician Assistant

## 2023-06-03 DIAGNOSIS — E786 Lipoprotein deficiency: Secondary | ICD-10-CM

## 2023-06-03 DIAGNOSIS — E7849 Other hyperlipidemia: Secondary | ICD-10-CM

## 2023-06-03 MED ORDER — WEGOVY 0.25 MG/0.5ML ~~LOC~~ SOAJ: 0.2500 mg | SUBCUTANEOUS | 0 refills | Status: DC

## 2023-06-03 NOTE — Telephone Encounter (Signed)
 Sharrie still needs a prescription for below medication sent to below pharmacy, her regular pharmacy does not have, she has been trying to get this since last week. Some how it keeps coming to our office instead of yours.  Semaglutide-Weight Management (WEGOVY) 0.25 MG/0.5ML [id number] - [place], [place] - [address] AT [facility] OF [address] Phone: [phone]  Fax: [phone]

## 2023-06-04 ENCOUNTER — Telehealth (INDEPENDENT_AMBULATORY_CARE_PROVIDER_SITE_OTHER): Payer: Self-pay

## 2023-06-04 NOTE — Telephone Encounter (Signed)
 PA for Wegovy 0.25 has been submitted, awaiting PA questions.

## 2023-06-05 NOTE — Telephone Encounter (Signed)
 Received through Cover My Meds that Kara Wilkinson has been denied. Not a covered benefit under plan.

## 2023-06-16 ENCOUNTER — Encounter (INDEPENDENT_AMBULATORY_CARE_PROVIDER_SITE_OTHER): Payer: Self-pay

## 2023-06-23 ENCOUNTER — Telehealth (INDEPENDENT_AMBULATORY_CARE_PROVIDER_SITE_OTHER): Payer: Self-pay | Admitting: Otolaryngology

## 2023-06-23 NOTE — Telephone Encounter (Signed)
 Confirmed appt and location with patient for 06/24/2023.

## 2023-06-24 ENCOUNTER — Ambulatory Visit (INDEPENDENT_AMBULATORY_CARE_PROVIDER_SITE_OTHER): Payer: 59 | Admitting: Otolaryngology

## 2023-06-24 ENCOUNTER — Encounter (INDEPENDENT_AMBULATORY_CARE_PROVIDER_SITE_OTHER): Payer: Self-pay

## 2023-06-24 VITALS — BP 159/92 | HR 60 | Ht 65.0 in | Wt 263.0 lb

## 2023-06-24 DIAGNOSIS — H8112 Benign paroxysmal vertigo, left ear: Secondary | ICD-10-CM

## 2023-06-24 DIAGNOSIS — H903 Sensorineural hearing loss, bilateral: Secondary | ICD-10-CM | POA: Diagnosis not present

## 2023-06-24 NOTE — Progress Notes (Signed)
 Patient ID: Kara Wilkinson, female   DOB: 08/03/1975, 48 y.o.   MRN: [id number]  Follow-up: Left BPPV, bilateral sensorineural hearing loss  HPI: The patient is a 48 year old female who returns today for her follow-up evaluation.  The patient was previously seen for bilateral hearing loss, recurrent dizziness, and left ear Mnire's disease.  At her last visit, she was noted to have left BPPV.  She was treated with left Epley maneuver.  According to the patient, she continued to have recurrent dizziness for 3 weeks after her last visit.  The dizziness finally resolved over the past week.  Currently she denies any otalgia, otorrhea, or vertigo.  She denies any recent change in her hearing.  Exam: General: Communicates without difficulty, well nourished, no acute distress. Head: Normocephalic, no evidence injury, no tenderness, facial buttresses intact without stepoff. Face/sinus: No tenderness to palpation and percussion. Facial movement is normal and symmetric. Eyes: PERRL, EOMI. No scleral icterus, conjunctivae clear. Neuro: CN II exam reveals vision grossly intact.  No nystagmus at any point of gaze. Ears: Auricles well formed without lesions.  Ear canals are intact without mass or lesion.  No erythema or edema is appreciated.  The TMs are intact without fluid. Nose: External evaluation reveals normal support and skin without lesions.  Dorsum is intact.  Anterior rhinoscopy reveals congested mucosa over anterior aspect of inferior turbinates and intact septum.  No purulence noted. Oral:  Oral cavity and oropharynx are intact, symmetric, without erythema or edema.  Mucosa is moist without lesions. Neck: Full range of motion without pain.  There is no significant lymphadenopathy.  No masses palpable.  Thyroid bed within normal limits to palpation.  Parotid glands and submandibular glands equal bilaterally without mass.  Trachea is midline. Neuro:  CN 2-12 grossly intact. Vestibular: No nystagmus at any point  of gaze. Dix Hallpike negative. Vestibular: There is no nystagmus with pneumatic pressure on either tympanic membrane or Valsalva. The cerebellar examination is unremarkable.   Assessment: 1.  The patient's left BPPV has resolved.  Her Dix-Hallpike maneuver is negative today. 2.  Her ear canals, tympanic membranes, and middle ear spaces are all normal. 3.  Subjectively stable bilateral mild high-frequency sensorineural hearing loss. 4.  History of left ear Mnire's disease.  Plan: 1.  The physical exam findings are reviewed with the patient. 2.  Continue with 1500 mg low-salt diet. 3.  The patient will return for reevaluation in 4 months.

## 2023-06-30 ENCOUNTER — Other Ambulatory Visit: Payer: 59

## 2023-06-30 ENCOUNTER — Ambulatory Visit (INDEPENDENT_AMBULATORY_CARE_PROVIDER_SITE_OTHER): Payer: 59 | Admitting: Physician Assistant

## 2023-06-30 ENCOUNTER — Encounter (INDEPENDENT_AMBULATORY_CARE_PROVIDER_SITE_OTHER): Payer: Self-pay | Admitting: Physician Assistant

## 2023-06-30 VITALS — BP 135/80 | HR 67 | Temp 98.5°F | Ht 65.0 in | Wt 262.0 lb

## 2023-06-30 DIAGNOSIS — E559 Vitamin D deficiency, unspecified: Secondary | ICD-10-CM

## 2023-06-30 DIAGNOSIS — R7303 Prediabetes: Secondary | ICD-10-CM

## 2023-06-30 DIAGNOSIS — H8110 Benign paroxysmal vertigo, unspecified ear: Secondary | ICD-10-CM | POA: Diagnosis not present

## 2023-06-30 DIAGNOSIS — Z6841 Body Mass Index (BMI) 40.0 and over, adult: Secondary | ICD-10-CM

## 2023-06-30 DIAGNOSIS — E7849 Other hyperlipidemia: Secondary | ICD-10-CM

## 2023-06-30 DIAGNOSIS — E785 Hyperlipidemia, unspecified: Secondary | ICD-10-CM | POA: Diagnosis not present

## 2023-06-30 DIAGNOSIS — E786 Lipoprotein deficiency: Secondary | ICD-10-CM

## 2023-06-30 MED ORDER — METFORMIN HCL 500 MG PO TABS
500.0000 mg | ORAL_TABLET | Freq: Every day | ORAL | 0 refills | Status: DC
Start: 2023-06-30 — End: 2023-08-04

## 2023-06-30 NOTE — Progress Notes (Signed)
 SUBJECTIVE: Discussed the use of AI scribe software for clinical note transcription with the patient, who gave verbal consent to proceed. Chief Complaint: Obesity  Interim History: She is down 1 lb since last visit Down 25 lbs overall TBW loss of 8.7%  [name] is here to discuss her progress with her obesity treatment plan. She is on the Category 3 Plan and states she is following her eating plan approximately 50 % of the time. She states she is exercising walking 10-15 minutes 3 times per week. Kara Wilkinson is a 48 year old female who presents for follow-up of her obesity treatment plan.  She has lost one pound since her last visit. She has been unable to obtain Wegovy due to insurance coverage issues and is considering alternative medications for weight management. She is also taking fiber pills three times a day, which help with bowel regularity.  She has a history of prediabetes and we discussed metformin to improve insulin sensitivity and prevent progression to diabetes. She is currently not on any medication for prediabetes.  She has a history of vitamin D deficiency and is currently taking vitamin D supplements once a week. Her vitamin D levels were previously very low.  She has a history of hyperlipidemia with an HDL under 40.   She experienced benign paroxysmal positional vertigo (BPPV) after her last visit, which was managed with the Epley maneuver by her ENT. She experienced severe vertigo for three weeks following the maneuver, with up to eight or nine episodes a day, but this has since resolved. The vertigo was triggered by head movements, particularly turning her head, which impacted her ability to exercise during that period.  She works from home and has the flexibility to manage her schedule, including taking time off for family care. Her husband has undergone multiple surgeries, including hernia and cataract surgeries, and she has been involved in his care. She has been  able to take time off work to support him, utilizing her vacation time. She reports sleeping better and not waking up as much at night. No current exercise due to recent vertigo episodes.  OBJECTIVE: Visit Diagnoses: Problem List Items Addressed This Visit     Morbid obesity (HCC)   Relevant Medications   metFORMIN (GLUCOPHAGE) 500 MG tablet   Vitamin D deficiency   Relevant Orders   VITAMIN D 25 Hydroxy (Vit-D Deficiency, Fractures)   Prediabetes - Primary   Relevant Medications   metFORMIN (GLUCOPHAGE) 500 MG tablet   BMI 40.0-44.9, adult (HCC)   Relevant Medications   metFORMIN (GLUCOPHAGE) 500 MG tablet   Low HDL (under 40)   Other hyperlipidemia   Other Visit Diagnoses       Benign paroxysmal positional vertigo, unspecified laterality         Obesity She has lost one pound since the last visit. Insurance coverage for Reginal Lutes is difficult due to cost. Alternatives like Zepbound are also costly.  Metformin is considered for appetite suppression and metabolic benefits, relevant to her prediabetes for off label use . Potential side effects, such as gastrointestinal upset, were discussed, and the importance of taking it with food was emphasized. Metformin's potential to increase fertility was noted, but she has a Mirena IUD for birth control. - Start metformin at 250 mg once daily with food. Increase to 500 mg if tolerated after one week. - Provide education on metformin, including its effects on insulin resistance and potential side effects such as gastrointestinal upset if not taken with food. -  Discuss the potential for metformin to increase fertility, but note that she has a Mirena IUD for birth control. - Schedule follow-up in four weeks to assess the effectiveness of metformin.  Prediabetes Metformin was discussed  improve insulin sensitivity and prevent progression to type 2 diabetes. The potential benefits of metformin in preventing diabetes progression were discussed, along  with its off-label use for incretin effect and weight loss. Lab Results  Component Value Date   HGBA1C 5.4 02/12/2023   HGBA1C 6.0 (H) 09/11/2022   Lab Results  Component Value Date   LDLCALC 65 02/12/2023   CREATININE 0.78 02/12/2023   INSULIN  Date Value Ref Range Status  02/12/2023 21.8 2.6 - 24.9 uIU/mL Final  ] - Start metformin 250 mg daily and increase to 500 mg daily if tolerated well as outlined in the obesity plan. Continue working on nutrition plan to decrease simple carbohydrates, increase lean proteins and exercise to promote weight loss, improve glycemic control and prevent progression to Type 2 diabetes.    Hyperlipidemia She has hyperlipidemia with an HDL under 40. On Crestor 5 mg daily. No side effects.' Last lipids Lab Results  Component Value Date   CHOL 118 02/12/2023   HDL 32 (L) 02/12/2023   LDLCALC 65 02/12/2023   TRIG 115 02/12/2023   CHOLHDL 6.1 (H) 01/31/2022    Weight management was emphasized to improve lipid levels. Continue to work on nutrition plan -decreasing simple carbohydrates, increasing lean proteins, decreasing saturated fats and cholesterol , avoiding trans fats and exercise as able to promote weight loss, improve lipids and decrease cardiovascular risks.   Vitamin D Deficiency She is currently taking Ergocalciferol 50,000 units once a week. Monitoring vitamin D levels is important to prevent accumulation.Last vitamin D Lab Results  Component Value Date   VD25OH 56.9 02/12/2023   Low vitamin D levels can be associated with adiposity and may result in leptin resistance and weight gain. Also associated with fatigue.  Currently on vitamin D supplementation without any adverse effects such as nausea, vomiting or muscle weakness.  - Order vitamin D level with upcoming labs to ensure levels are not too high with planned labs for upcoming physical with PCP.  Benign Paroxysmal Positional Vertigo (BPPV) She experienced BPPV after her last  visit, treated with the Epley maneuver by her ENT. She had severe vertigo for three weeks but has since improved and is asymptomatic.  General Health Maintenance She is taking fiber supplements to aid digestion and managing her weight through lifestyle modifications. - Continue fiber supplements as tolerated. - Encourage regular physical activity and monitor weight.  Follow-up She is scheduled for follow-up appointments and lab work to monitor her conditions and the effects of metformin. - Schedule follow-up appointment on April 21st at 4 PM. - Ensure labs are completed, including lipid panel, liver function tests, and vitamin D level.  Vitals Temp: 98.5 F (36.9 C) BP: 135/80 Pulse Rate: 67 SpO2: 98 %   Anthropometric Measurements Height: 5\' 5"  (1.651 m) Weight: 262 lb (118.8 kg) BMI (Calculated): 43.6 Weight at Last Visit: 263 lb Weight Lost Since Last Visit: 1 lb Weight Gained Since Last Visit: 0 Starting Weight: 287 lb Total Weight Loss (lbs): 25 lb (11.3 kg) Peak Weight: 287 lb   Body Composition  Body Fat %: 51.9 % Fat Mass (lbs): 136.2 lbs Muscle Mass (lbs): 119.8 lbs Total Body Water (lbs): 98.8 lbs Visceral Fat Rating : 17   Other Clinical Data Fasting: yes Labs: no Today's Visit #:  [id number]     ASSESSMENT AND PLAN:  Diet: Kysha is currently in the action stage of change. As such, her goal is to continue with weight loss efforts and has agreed to the Category 3 Plan.   Exercise:  For substantial health benefits, adults should do at least 150 minutes (2 hours and 30 minutes) a week of moderate-intensity, or 75 minutes (1 hour and 15 minutes) a week of vigorous-intensity aerobic physical activity, or an equivalent combination of moderate- and vigorous-intensity aerobic activity. Aerobic activity should be performed in episodes of at least 10 minutes, and preferably, it should be spread throughout the week. and Adults should also include  muscle-strengthening activities that involve all major muscle groups on 2 or more days a week.  Behavior Modification:  We discussed the following Behavioral Modification Strategies today: increasing lean protein intake, decreasing simple carbohydrates, increasing vegetables, increase H2O intake, increase high fiber foods, emotional eating strategies , avoiding temptations, and planning for success. We discussed various medication options to help Annelle with her weight loss efforts and we both agreed to begin metformin 250 mg daily and increase to 500 mg daily if tolerated well and continue to work on nutritional and behavioral strategies to promote weight loss.  .  Return in about 4 weeks (around 07/28/2023).Marland Kitchen She was informed of the importance of frequent follow up visits to maximize her success with intensive lifestyle modifications for her multiple health conditions.  Attestation Statements:   Reviewed by clinician on day of visit: allergies, medications, problem list, medical history, surgical history, family history, social history, and previous encounter notes.   Time spent on visit including pre-visit chart review and post-visit care and charting was 41 minutes  Jaqulyn Chancellor,PA-C

## 2023-06-30 NOTE — Progress Notes (Signed)
 Lab only

## 2023-07-01 LAB — VITAMIN D 25 HYDROXY (VIT D DEFICIENCY, FRACTURES): Vit D, 25-Hydroxy: 82 ng/mL (ref 30–100)

## 2023-07-01 LAB — HEPATIC FUNCTION PANEL
AG Ratio: 1.2 (calc) (ref 1.0–2.5)
ALT: 16 U/L (ref 6–29)
AST: 18 U/L (ref 10–35)
Albumin: 4.1 g/dL (ref 3.6–5.1)
Alkaline phosphatase (APISO): 82 U/L (ref 37–153)
Bilirubin, Direct: 0.2 mg/dL (ref 0.0–0.2)
Globulin: 3.3 g/dL (ref 1.9–3.7)
Indirect Bilirubin: 1 mg/dL (ref 0.2–1.2)
Total Bilirubin: 1.2 mg/dL (ref 0.2–1.2)
Total Protein: 7.4 g/dL (ref 6.1–8.1)

## 2023-07-01 LAB — LIPID PANEL
Cholesterol: 128 mg/dL (ref <200)
HDL: 35 mg/dL — ABNORMAL LOW (ref >=50)
LDL Cholesterol (Calc): 73 mg/dL
Non-HDL Cholesterol (Calc): 93 mg/dL (ref <130)
Total CHOL/HDL Ratio: 3.7 (calc) (ref <5.0)
Triglycerides: 123 mg/dL (ref <150)

## 2023-07-07 ENCOUNTER — Ambulatory Visit: Payer: 59 | Admitting: Internal Medicine

## 2023-07-08 ENCOUNTER — Ambulatory Visit: Payer: 59 | Admitting: Internal Medicine

## 2023-07-14 ENCOUNTER — Ambulatory Visit (INDEPENDENT_AMBULATORY_CARE_PROVIDER_SITE_OTHER): Admitting: Internal Medicine

## 2023-07-14 VITALS — BP 134/74 | HR 63 | Temp 96.5°F | Ht 65.0 in | Wt 270.0 lb

## 2023-07-14 DIAGNOSIS — G4733 Obstructive sleep apnea (adult) (pediatric): Secondary | ICD-10-CM

## 2023-07-14 DIAGNOSIS — E786 Lipoprotein deficiency: Secondary | ICD-10-CM

## 2023-07-14 DIAGNOSIS — E7800 Pure hypercholesterolemia, unspecified: Secondary | ICD-10-CM

## 2023-07-14 DIAGNOSIS — E559 Vitamin D deficiency, unspecified: Secondary | ICD-10-CM

## 2023-07-14 DIAGNOSIS — H8110 Benign paroxysmal vertigo, unspecified ear: Secondary | ICD-10-CM | POA: Diagnosis not present

## 2023-07-14 DIAGNOSIS — Z6841 Body Mass Index (BMI) 40.0 and over, adult: Secondary | ICD-10-CM

## 2023-07-14 DIAGNOSIS — R7302 Impaired glucose tolerance (oral): Secondary | ICD-10-CM | POA: Diagnosis not present

## 2023-07-14 NOTE — Patient Instructions (Addendum)
 Continue low dose Rosuvastatin. Continue high dose Vitamin D supplement weekly. Continue Metformin 500 mg daily for glucose intolerance. No recent episodes of vertigo. Return November 10 for health maintenance exam.

## 2023-07-14 NOTE — Progress Notes (Signed)
 Kara Care Team: Margaree Mackintosh, MD as PCP - General (Internal Medicine)  Visit Date: 07/14/23  Subjective:   Chief Complaint  Kara presents with   Follow-up  Kara Wilkinson,Female DOB:1975/05/19,48 y.o. UJW:[id number]   48 y.o. Female presents today for 3 months follow-up for Hyperlipidemia. Treated with Rosuvastatin 5 mg daily (started on this 01/2022), on 04/04/2023 via MyChart she said that she had been having arthralgias, which she attributed to to her Crestor, and wanted to discuss alternative medications for lipid control. It was recommended that she try CoQ10 to relieve joint pain. She was seen on 05/08/2023 by Lazaro Arms, PA-C with [facility], and at that visit she told him her arthralgias had improved slightly since starting CoQ10. In review of 07/01/2023 Lipid Panel, which was WNL except for HDL low at 35, though an improvement from 32 on 02/13/2023 and this is a trend going back 9 years. Other labs performed were Hepatic Function Panel, which was WNL, and Vitamin-D was WNL at 82. Coronary Cardiac Scoring in 2023 was 0. Says that she has been taking 200 mg of CoQ10 daily, which has significantly improved her pain. Recently started on Metformin 500 mg daily per Shawn Rayburn for Pre-Diabetes. Seen by Dr. Newman Pies with ENT for Benign Paroxysmal Positional Vertigo. Regarding her BPPV, she says this has been well managed overall by consuming low sodium, no caffeine, and no artificial sugars.   Vaccine Counseling: Due for Shingles and Covid-19 - she declines Covid-19 this visit but does plan on getting her Shingles due to fhx.  Past Medical History:  Diagnosis Date   Anemia    Bilateral swelling of feet    Chronic headache    High cholesterol    Hyperlipidemia    Meniere's disease    Multiple food allergies    Sleep apnea    wear c pap   Sleep apnea     Allergies  Allergen Reactions   Amoxicillin Hives   Avocado Diarrhea   Caffeine     And  artificial sweetners    Family History  Problem Relation Age of Onset   Cancer Mother    Stroke Mother    Hyperlipidemia Mother    Hypertension Mother    Breast cancer Mother    Colon polyps Mother    Depression Mother    Sleep apnea Mother    Obesity Mother    Obesity Father    Sleep apnea Father    Cancer Father    Stroke Father    Hyperlipidemia Father    Hypertension Father    Colon polyps Father    Kidney disease Father    Diabetes Father    Bladder Cancer Father    Heart disease Father    Colon cancer Neg Hx    Crohn's disease Neg Hx    Esophageal cancer Neg Hx    Rectal cancer Neg Hx    Stomach cancer Neg Hx    Ulcerative colitis Neg Hx   Sister and Mother w/ hx of Shingles. Social History   Social History Narrative   Right handed    Does not drink caffeine   Pt wears prescription glasses   Review of Systems  Musculoskeletal:  Negative for joint pain (improved with consistent 200 mg CoQ10 daily).  All other systems reviewed and are negative.    Objective:  Vitals: BP 134/74   Pulse 63   Temp (!) 96.5 F (35.8 C) (Temporal)   Ht 5'  5" (1.651 m)   Wt 270 lb (122.5 kg)   SpO2 98%   BMI 44.93 kg/m   Physical Exam Vitals and nursing note reviewed.  Constitutional:      General: She is not in acute distress.    Appearance: Normal appearance. She is not toxic-appearing.  HENT:     Head: Normocephalic and atraumatic.  Neck:     Thyroid: No thyromegaly.  Cardiovascular:     Rate and Rhythm: Normal rate and regular rhythm. No extrasystoles are present.    Pulses: Normal pulses.     Heart sounds: Normal heart sounds. No murmur heard.    No friction rub. No gallop.  Pulmonary:     Effort: Pulmonary effort is normal. No respiratory distress.     Breath sounds: Normal breath sounds. No wheezing or rales.  Skin:    General: Skin is warm and dry.  Neurological:     Mental Status: She is alert and oriented to person, place, and time. Mental status is at  baseline.  Psychiatric:        Mood and Affect: Mood normal.        Behavior: Behavior normal.        Thought Content: Thought content normal.        Judgment: Judgment normal.     Results:  Studies Obtained And Personally Reviewed By Me:  Coronary Cardiac Scoring in 2023 was 0.  Labs:     Component Value Date/Time   NA 140 02/12/2023 0758   K 4.3 02/12/2023 0758   CL 103 02/12/2023 0758   CO2 23 02/12/2023 0758   GLUCOSE 89 02/12/2023 0758   GLUCOSE 78 07/25/2022 1322   BUN 9 02/12/2023 0758   CREATININE 0.78 02/12/2023 0758   CREATININE 0.77 07/23/2022 1236   CALCIUM 8.8 02/12/2023 0758   PROT 7.4 06/30/2023 1201   PROT 6.6 02/12/2023 0758   ALBUMIN 3.9 02/12/2023 0758   AST 18 06/30/2023 1201   ALT 16 06/30/2023 1201   ALKPHOS 85 02/12/2023 0758   BILITOT 1.2 06/30/2023 1201   BILITOT 1.1 02/12/2023 0758   GFRNONAA >60 07/25/2022 1322    Lab Results  Component Value Date   WBC 7.3 02/12/2023   HGB 13.1 02/12/2023   HCT 42.2 02/12/2023   MCV 98 (H) 02/12/2023   PLT 256 02/12/2023   Lab Results  Component Value Date   CHOL 128 06/30/2023   HDL 35 (L) 06/30/2023   LDLCALC 73 06/30/2023   TRIG 123 06/30/2023   CHOLHDL 3.7 06/30/2023   Lab Results  Component Value Date   HGBA1C 5.4 02/12/2023    Lab Results  Component Value Date   TSH 2.34 07/23/2022    Assessment & Plan:   Hyperlipidemia treated with Rosuvastatin 5 mg daily (started on this 01/2022), on 04/04/2023 via MyChart she said that she had been having arthralgias, which she attributed to to her Crestor, and wanted to discuss alternative medications for lipid control. It was recommended that she try coenzyme Q to treat statin induced joint pain.  On 07/01/2023 Lipid Panel was WNL except for HDL low at 35, though an improvement from 32 on 02/13/2023 and this is a trend going back 9 years.   Coronary Cardiac Scoring in 2023 was 0. Says that she has been taking 200 mg of CoQ10 daily, which has  significantly improved her pain. Hepatic Function Panel  drawn March 17 was normal on statin medication.  Vitamin D deficiency. Level is 82 -  recommended she continue 50,000 units Vitamin D3 weekly.   Recently started on Metformin 500 mg daily per Ines Bloomer Rayburn, PA at  [facility] for glucose intolerance   Seen by Dr. Newman Pies with ENT for Benign Paroxysmal Positional Vertigo. Well managed overall by consuming low sodium, no caffeine, and no artificial sugars.   Vaccine Counseling: Due for Shingles and Covid-19 - she declines Covid-19 this visit but does plan on getting her Shingles due to fhx.   BMI 44 followed at [facility]  Plan: RTC November 10th for health maintenance exam  I,Emily Lagle,acting as a scribe for Margaree Mackintosh, MD.,have documented all relevant documentation on the behalf of Margaree Mackintosh, MD,as directed by  Margaree Mackintosh, MD while in the presence of Margaree Mackintosh, MD.   I, Margaree Mackintosh, MD, have reviewed all documentation for this visit. The documentation on 07/14/23 for the exam, diagnosis, procedures, and orders are all accurate and complete.

## 2023-08-04 ENCOUNTER — Encounter (INDEPENDENT_AMBULATORY_CARE_PROVIDER_SITE_OTHER): Payer: Self-pay | Admitting: Physician Assistant

## 2023-08-04 ENCOUNTER — Ambulatory Visit (INDEPENDENT_AMBULATORY_CARE_PROVIDER_SITE_OTHER): Admitting: Physician Assistant

## 2023-08-04 ENCOUNTER — Other Ambulatory Visit (HOSPITAL_COMMUNITY): Payer: Self-pay

## 2023-08-04 VITALS — BP 137/82 | HR 56 | Temp 97.9°F | Ht 65.0 in | Wt 265.0 lb

## 2023-08-04 DIAGNOSIS — G4733 Obstructive sleep apnea (adult) (pediatric): Secondary | ICD-10-CM

## 2023-08-04 DIAGNOSIS — E786 Lipoprotein deficiency: Secondary | ICD-10-CM | POA: Diagnosis not present

## 2023-08-04 DIAGNOSIS — E559 Vitamin D deficiency, unspecified: Secondary | ICD-10-CM

## 2023-08-04 DIAGNOSIS — Z6841 Body Mass Index (BMI) 40.0 and over, adult: Secondary | ICD-10-CM

## 2023-08-04 DIAGNOSIS — R7303 Prediabetes: Secondary | ICD-10-CM | POA: Diagnosis not present

## 2023-08-04 MED ORDER — TIRZEPATIDE-WEIGHT MANAGEMENT 2.5 MG/0.5ML ~~LOC~~ SOAJ
2.5000 mg | SUBCUTANEOUS | 0 refills | Status: DC
  Filled 2023-08-04: qty 2, 28d supply, fill #0

## 2023-08-04 MED ORDER — METFORMIN HCL 500 MG PO TABS: 250.0000 mg | ORAL_TABLET | Freq: Every day | ORAL | 0 refills | Status: DC

## 2023-08-04 NOTE — Progress Notes (Unsigned)
 SUBJECTIVE: Discussed the use of AI scribe software for clinical note transcription with the patient, who gave verbal consent to proceed.  Chief Complaint: Obesity  Interim History: She is up 3 lbs since last visit.  Down 22 lbs overall TBW loss of 7.7%  Kara Wilkinson is here to discuss her progress with her obesity treatment plan. She is on the Category 3 Plan and states she is following her eating plan approximately 40-50 % of the time. She states she is exercising yard work 60 minutes 5-6 times per week.  Kara Wilkinson is a 48 year old female with obesity who presents for follow-up of her obesity treatment plan.  She has experienced weight fluctuations, initially gaining weight after starting metformin , but has since lost 22 pounds, marking a 7.7% reduction in her overall weight. She attributes some weight gain to increased hunger, describing it as feeling like she 'had a tapeworm'. Her adherence to the treatment plan is about 40% due to constant hunger.  She started metformin  at half a tablet and then increased to a full tablet. She did not notice any effects with the half tablet, but experienced stomach discomfort with the full tablet, which she found helpful as a reminder not to overeat. She also experienced increased hunger and weight gain during the first week of taking the full tablet. She is considering returning to the half tablet dose. No loose stools while on metformin .  She has a history of prediabetes and dyslipidemia, with a low HDL under 40. She is concerned about developing diabetes and is willing to continue metformin  if it helps prevent this. She also has a history of vitamin D  deficiency.  She reports severe obstructive sleep apnea, described as 'remarkably severe'. She is not currently exercising regularly but engages in yard work for about six to seven hours a week and rides her bike occasionally. She has been under significant stress due to her husband's health issues,  which may have impacted her weight management efforts.  In terms of her menstrual cycle, she has had her period every other week since starting metformin , with severe premenstrual syndrome (PMS) symptoms, although these have settled down recently. OBJECTIVE: Visit Diagnoses: Problem List Items Addressed This Visit     OSA (obstructive sleep apnea)   Relevant Medications   tirzepatide  (ZEPBOUND ) 2.5 MG/0.5ML Pen   Morbid obesity (HCC) - Primary   Relevant Medications   metFORMIN  (GLUCOPHAGE ) 500 MG tablet   tirzepatide  (ZEPBOUND ) 2.5 MG/0.5ML Pen   Vitamin D  deficiency   Prediabetes   Relevant Medications   metFORMIN  (GLUCOPHAGE ) 500 MG tablet   Low HDL (under 40)  Obesity Obesity management is ongoing. Weight has fluctuated, with a recent increase attributed to stress and medication changes. Current weight is 22 pounds down from baseline, but recent weight gain noted after increasing metformin  dosage. Hunger and dietary adherence are challenges. Exercise includes yard work and biking. Metformin  may contribute to appetite changes and gastrointestinal discomfort, but she finds it manageable. She is open to trying Zepbound  and considering bariatric surgery if necessary. Discussed the potential for weight regain post-bariatric surgery and the importance of medication management. - Prescribe metformin  250 mg, half a tablet daily. - Advise on dietary modifications to avoid fatty foods and focus on protein intake. - Encourage hydration and fiber intake to manage gastrointestinal side effects. - Discuss potential bariatric surgery as a future option if medication management is insufficient.  Severe obstructive sleep apnea Severe obstructive sleep apnea is present, contributing to overall  health concerns. Weight loss is critical for management and may improve sleep apnea severity. Zepbound  is considered due to its FDA indication for obstructive sleep apnea and potential to aid in weight loss. She  is willing to try Zepbound  to manage sleep apnea and aid in weight loss. - Submit prior authorization for Zepbound  for obstructive sleep apnea and weight loss. - Educate on Zepbound  administration and potential side effects, including nausea, vomiting, diarrhea, constipation, and gallbladder issues. - Discuss potential improvement in sleep apnea with weight loss.  Prediabetes Last A1c was ***  Medication(s): {dwwpharmacotherapy:29109} Polyphagia:{dwwyes:29172} Lab Results  Component Value Date   HGBA1C 5.4 02/12/2023   HGBA1C 6.0 (H) 09/11/2022   Lab Results  Component Value Date   INSULIN  21.8 02/12/2023   INSULIN  41.4 (H) 09/11/2022    Plan: {dwwmed:29123} {dwwpharmacotherapy:29109}   Vitals Temp: 97.9 F (36.6 C) BP: 137/82 Pulse Rate: (!) 56 SpO2: 100 %   Anthropometric Measurements Height: 5\' 5"  (1.651 m) Weight: 265 lb (120.2 kg) BMI (Calculated): 44.1 Weight at Last Visit: 262 lb Weight Lost Since Last Visit: 0 Weight Gained Since Last Visit: 3 lb Starting Weight: 287 lb Total Weight Loss (lbs): 22 lb (9.979 kg) Peak Weight: 287 lb   Body Composition  Body Fat %: 53.3 % Fat Mass (lbs): 141.6 lbs Muscle Mass (lbs): 117.8 lbs Visceral Fat Rating : 17   Other Clinical Data Fasting: no Labs: no Today's Visit #: 13 Starting Date: 09/11/22     ASSESSMENT AND PLAN:  Diet: Kara Wilkinson is currently in the action stage of change. As such, her goal is to continue with weight loss efforts. She has agreed to Category 3 Plan.  Exercise: Kara Wilkinson has been instructed to work up to a goal of 150 minutes of combined cardio and strengthening exercise per week for weight loss and overall health benefits.   Behavior Modification:  We discussed the following Behavioral Modification Strategies today: increasing lean protein intake, decreasing simple carbohydrates, increasing vegetables, increase H2O intake, increase high fiber foods, meal planning and cooking  strategies, better snacking choices, emotional eating strategies , avoiding temptations, and planning for success. We discussed various medication options to help Kara Wilkinson with her weight loss efforts and we both agreed to start Zepbound  for severe obstructive sleep apnea as primary indication and continue to work on nutritional and behavioral strategies to promote weight loss.  .  Return in about 4 weeks (around 09/01/2023).Kara Wilkinson She was informed of the importance of frequent follow up visits to maximize her success with intensive lifestyle modifications for her multiple health conditions.  Attestation Statements:   Reviewed by clinician on day of visit: allergies, medications, problem list, medical history, surgical history, family history, social history, and previous encounter notes.   Time spent on visit including pre-visit chart review and post-visit care and charting was *** minutes.    Mylinda Brook, PA-C

## 2023-08-05 ENCOUNTER — Telehealth (INDEPENDENT_AMBULATORY_CARE_PROVIDER_SITE_OTHER): Payer: Self-pay

## 2023-08-05 NOTE — Telephone Encounter (Signed)
 Prior auth started for Zepbound .  Awaiting determination.

## 2023-08-05 NOTE — Telephone Encounter (Signed)
-----   Message from [facility] sent at 08/05/2023  7:24 AM EDT ----- Regarding: PA for Zepbound  PA for Zepbound  for OSA  Thanks Shawn

## 2023-08-06 NOTE — Telephone Encounter (Signed)
 Dear Jaci Martini,  On behalf of [employer], [employer] [facility] is responsible for reviewing pharmacy services provided to [employer]. We received a request from your prescriber for coverage of Zepbound  Inj 2.5/0.5. We reviewed all of the information you and/or your doctor sent to us  and sent the information to an appropriate physician specialist if needed. Unfortunately, we must deny coverage for Zepbound . Why was my request denied?  This request was denied because you did not meet the following requirements: The requested medication and/or diagnosis are not a covered benefit and excluded from coverage in accordance with the terms and conditions of your plan benefit. Therefore, the request has been administratively denied.

## 2023-08-06 NOTE — Telephone Encounter (Signed)
 Zepbound  has been denied for OSA.  Resubmitted for Obesity.  Awaiting results.

## 2023-09-10 ENCOUNTER — Ambulatory Visit (INDEPENDENT_AMBULATORY_CARE_PROVIDER_SITE_OTHER): Admitting: Physician Assistant

## 2023-09-28 NOTE — Progress Notes (Unsigned)
 SUBJECTIVE: Discussed the use of AI scribe software for clinical note transcription with the patient, who gave verbal consent to proceed.  Chief Complaint: Obesity  Interim History: She is up 6 lbs since her last visit.   Down 16 lbs overall   Kara Wilkinson is here to discuss her progress with her obesity treatment plan. She is on the Category 3 Plan and states she is following her eating plan approximately 50 % of the time. She states she is exercising 120 minutes 7 times per week.  Kara Wilkinson is a 48 year old female who presents for follow-up on her obesity treatment plan.  She is experiencing increased stress due to work changes, including learning two new jobs and being in meetings all day, which has impacted her ability to maintain physical activity and healthy eating habits. She struggles with cravings for sweets, particularly during the day when she is at home alone, and finds it difficult to manage her diet due to her husband's dietary needs, which require the inclusion of starches in meals.  She recently experienced a burst ovarian cyst, which caused significant pain. This event added to her stress and impacted her overall well-being.  She is currently taking metformin  at a half dose without any issues, even when taken without food. She has previously tried topiramate , which worsened her headaches. She cannot tolerate artificial sweeteners as they trigger vertigo, and she is exploring alternatives for managing her cravings, such as chewing gum without artificial sweeteners.  Her husband has lost weight using Mounjaro , but she has not been able to access similar medications due to insurance coverage issues. Pharmacotherapy: metformin  for primary indication of prediabetes- no side effects. Limited clinical response with wt. loss                                     Topamax  discontinued due to headaches, unfortunately it was helpful,but unable to tolerate    No Insurance coverage for  GLP medications OBJECTIVE: Visit Diagnoses: Problem List Items Addressed This Visit     Morbid obesity (HCC)   Relevant Medications   metFORMIN  (GLUCOPHAGE ) 500 MG tablet   Prediabetes - Primary   Relevant Medications   metFORMIN  (GLUCOPHAGE ) 500 MG tablet   Low HDL (under 40)   Abnormal craving   Relevant Medications   zonisamide (ZONEGRAN) 25 MG capsule   Other Visit Diagnoses       BMI 45.0-49.9, adult (HCC) Current BMI 45.1       Relevant Medications   metFORMIN  (GLUCOPHAGE ) 500 MG tablet     Obesity She is experiencing challenges in managing her weight due to increased work stress, changes in dietary habits to accommodate her husband's dietary needs, and cravings for sweets. She previously tried topiramate , which exacerbated her migraines, and is currently on metformin  without issues. Zepbound  or Mounjaro  were discussed as potential treatments, but these are not covered by insurance. Zonisamide, an anticonvulsant similar to topiramate , was suggested to help with appetite suppression. The risks of zonisamide include potential tingling sensations and sedation, but it is generally well-tolerated. Increasing metformin  dosage to 500 mg daily was also recommended to aid in weight management. - Increase metformin  to 500 mg daily. - Start zonisamide 25 mg at bedtime. - Contact insurance to inquire about approved medications for weight management. - Consider long life meals for higher protein options. - Focus on protein intake during meals to improve satiety.  Meds ordered this encounter  Medications   metFORMIN  (GLUCOPHAGE ) 500 MG tablet    Sig: Take 1 tablet (500 mg total) by mouth daily. Start with 1/2 tablet once daily with food.    Dispense:  30 tablet    Refill:  2   zonisamide (ZONEGRAN) 25 MG capsule    Sig: Take 1 capsule (25 mg total) by mouth daily.    Dispense:  30 capsule    Refill:  2     Prediabetes Last A1c was 5.4- improved and at goal. Insulin  21.8- improved,  but not at goal.   Medication(s): metformin  250 mg dailyNo GI or other SE currently.  Polyphagia:Yes Lab Results  Component Value Date   HGBA1C 5.4 02/12/2023   HGBA1C 6.0 (H) 09/11/2022   Lab Results  Component Value Date   INSULIN  21.8 02/12/2023   INSULIN  41.4 (H) 09/11/2022    Plan: Continue and increase dose Metformin  500 mg once daily breakfast Add zonisamide for cravings Continue working on nutrition plan to decrease simple carbohydrates, increase lean proteins and exercise to promote weight loss, improve glycemic control and prevent progression to Type 2 diabetes.   Abnormal Cravings She reports excessive cravings for sweets, particularly during the day when she is at home alone. She cannot use artificially sweetened gum due to vertigo triggered by artificial sweeteners.Topiramate  was prescribed previously , but stopped due to increased headaches which resolved after discontinuing.   Zonisamide was discussed as a potential treatment to help control these cravings. If zonisamide is not effective, naltrexone may be considered, although it has a risk of causing nausea.Patient denies history of glaucoma or kidney stones. She was informed of side effects and that zonisamide is teratogenic. She currently uses IUD for birth control.  - Start zonisamide 25 mg at bedtime to control cravings and titrate for response. - Consider naltrexone if zonisamide is not effective.   Hyperlipidemia/ Low HDL and family hx of CAD LDL is at goal. Medication(s): Crestor  5 mg daily. No SE Cardiovascular risk factors: dyslipidemia, family history of premature cardiovascular disease, obesity (BMI >= 30 kg/m2), and sedentary lifestyle  Lab Results  Component Value Date   CHOL 128 06/30/2023   HDL 35 (L) 06/30/2023   LDLCALC 73 06/30/2023   TRIG 123 06/30/2023   CHOLHDL 3.7 06/30/2023   CHOLHDL 6.1 (H) 01/31/2022   CHOLHDL 5.4 05/17/2014   Lab Results  Component Value Date   ALT 16 06/30/2023    AST 18 06/30/2023   ALKPHOS 85 02/12/2023   BILITOT 1.2 06/30/2023   The ASCVD Risk score (Arnett DK, et al., 2019) failed to calculate for the following reasons:   The valid total cholesterol range is 130 to 320 mg/dL  Plan: Continue Crestor  5 mg daily Continue to work on nutrition plan -decreasing simple carbohydrates, increasing lean proteins, decreasing saturated fats and cholesterol , avoiding trans fats and exercise as able to promote weight loss, improve lipids and decrease cardiovascular risks. Recheck labs in 3-4 months    Vitals Temp: 98.9 F (37.2 C) BP: 139/82 Pulse Rate: 69 SpO2: 99 %   Anthropometric Measurements Height: 5' 5 (1.651 m) Weight: 271 lb (122.9 kg) BMI (Calculated): 45.1 Weight at Last Visit: 265 lb Weight Lost Since Last Visit: 0 Weight Gained Since Last Visit: 6 lb Starting Weight: 287 lb Total Weight Loss (lbs): 16 lb (7.258 kg) Peak Weight: 287 lb   Body Composition  Body Fat %: 53.8 % Fat Mass (lbs): 145.8 lbs Muscle Mass (lbs): 118.8  lbs Visceral Fat Rating : 18   Other Clinical Data Fasting: No Labs: No Today's Visit #: 14 Starting Date: 09/11/22     ASSESSMENT AND PLAN:  Diet: Kara Wilkinson is currently in the action stage of change. As such, her goal is to continue with weight loss efforts. She has agreed to Category 3 Plan.  Exercise: Zairah has been instructed to continue exercising as is for weight loss and overall health benefits.   Behavior Modification:  We discussed the following Behavioral Modification Strategies today: increasing lean protein intake, decreasing simple carbohydrates, increasing vegetables, increase H2O intake, increase high fiber foods, no skipping meals, meal planning and cooking strategies, better snacking choices, avoiding temptations, planning for success, and decrease junk food . We discussed various medication options to help Kara Wilkinson with her weight loss efforts and we both agreed to  increase metformin  to 500 mg once daily and add zonisamide 25 mg daily for cravings, continue to work on nutritional and behavioral strategies to promote weight loss.  .  Return in about 6 weeks (around 11/10/2023).Kara Wilkinson She was informed of the importance of frequent follow up visits to maximize her success with intensive lifestyle modifications for her multiple health conditions.  Attestation Statements:   Reviewed by clinician on day of visit: allergies, medications, problem list, medical history, surgical history, family history, social history, and previous encounter notes.   Time spent on visit including pre-visit chart review and post-visit care and charting was 36 minutes.    Kara Gerhart, PA-C

## 2023-09-29 ENCOUNTER — Ambulatory Visit (INDEPENDENT_AMBULATORY_CARE_PROVIDER_SITE_OTHER): Admitting: Physician Assistant

## 2023-09-29 ENCOUNTER — Encounter (INDEPENDENT_AMBULATORY_CARE_PROVIDER_SITE_OTHER): Payer: Self-pay | Admitting: Physician Assistant

## 2023-09-29 VITALS — BP 139/82 | HR 69 | Temp 98.9°F | Ht 65.0 in | Wt 271.0 lb

## 2023-09-29 DIAGNOSIS — R7303 Prediabetes: Secondary | ICD-10-CM

## 2023-09-29 DIAGNOSIS — E786 Lipoprotein deficiency: Secondary | ICD-10-CM | POA: Diagnosis not present

## 2023-09-29 DIAGNOSIS — E559 Vitamin D deficiency, unspecified: Secondary | ICD-10-CM

## 2023-09-29 DIAGNOSIS — Z6841 Body Mass Index (BMI) 40.0 and over, adult: Secondary | ICD-10-CM

## 2023-09-29 DIAGNOSIS — R638 Other symptoms and signs concerning food and fluid intake: Secondary | ICD-10-CM

## 2023-09-29 DIAGNOSIS — G4733 Obstructive sleep apnea (adult) (pediatric): Secondary | ICD-10-CM

## 2023-09-29 MED ORDER — ZONISAMIDE 25 MG PO CAPS: 25.0000 mg | ORAL_CAPSULE | Freq: Every day | ORAL | 2 refills | Status: DC

## 2023-09-29 MED ORDER — METFORMIN HCL 500 MG PO TABS: 500.0000 mg | ORAL_TABLET | Freq: Every day | ORAL | 2 refills | Status: DC

## 2023-10-27 ENCOUNTER — Encounter (INDEPENDENT_AMBULATORY_CARE_PROVIDER_SITE_OTHER): Payer: Self-pay | Admitting: Otolaryngology

## 2023-10-27 ENCOUNTER — Ambulatory Visit (INDEPENDENT_AMBULATORY_CARE_PROVIDER_SITE_OTHER): Admitting: Otolaryngology

## 2023-10-27 VITALS — BP 163/84 | HR 78

## 2023-10-27 DIAGNOSIS — H903 Sensorineural hearing loss, bilateral: Secondary | ICD-10-CM | POA: Diagnosis not present

## 2023-10-27 DIAGNOSIS — H9319 Tinnitus, unspecified ear: Secondary | ICD-10-CM

## 2023-10-27 DIAGNOSIS — H9313 Tinnitus, bilateral: Secondary | ICD-10-CM

## 2023-10-27 DIAGNOSIS — H8102 Meniere's disease, left ear: Secondary | ICD-10-CM

## 2023-10-27 DIAGNOSIS — R42 Dizziness and giddiness: Secondary | ICD-10-CM

## 2023-10-28 DIAGNOSIS — H9313 Tinnitus, bilateral: Secondary | ICD-10-CM | POA: Insufficient documentation

## 2023-10-28 NOTE — Progress Notes (Signed)
 Patient ID: Kara Wilkinson, female   DOB: 1976/02/19, 48 y.o.   MRN: [id number]  Follow-up: Recurrent dizziness, bilateral sensorineural hearing loss  HPI: The patient is a 48 year old female who returns today for follow-up evaluation.  She has a history of recurrent dizziness, bilateral hearing loss, and left ear Mnire's disease.  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  She was successfully treated with the Epley maneuver.  She was placed on a low-salt diet.  The patient returns today reporting no recurrent spinning vertigo.  However, she has noted intermittent bilateral tinnitus.  She denies any change in her hearing.  Currently she denies any otalgia, otorrhea, or vertigo.  Exam: General: Communicates without difficulty, well nourished, no acute distress. Head: Normocephalic, no evidence injury, no tenderness, facial buttresses intact without stepoff. Face/sinus: No tenderness to palpation and percussion. Facial movement is normal and symmetric. Eyes: PERRL, EOMI. No scleral icterus, conjunctivae clear. Neuro: CN II exam reveals vision grossly intact.  No nystagmus at any point of gaze. Ears: Auricles well formed without lesions.  Ear canals are intact without mass or lesion.  No erythema or edema is appreciated.  The TMs are intact without fluid. Nose: External evaluation reveals normal support and skin without lesions.  Dorsum is intact.  Anterior rhinoscopy reveals congested mucosa over anterior aspect of inferior turbinates and intact septum.  No purulence noted. Oral:  Oral cavity and oropharynx are intact, symmetric, without erythema or edema.  Mucosa is moist without lesions. Neck: Full range of motion without pain.  There is no significant lymphadenopathy.  No masses palpable.  Thyroid bed within normal limits to palpation.  Parotid glands and submandibular glands equal bilaterally without mass.  Trachea is midline. Neuro:  CN 2-12 grossly intact. Vestibular: No nystagmus at any point of  gaze. Dix Hallpike negative. Vestibular: There is no nystagmus with pneumatic pressure on either tympanic membrane or Valsalva. The cerebellar examination is unremarkable.    Assessment: 1.  The patient's recurrent dizziness/left ear Mnire's disease is currently under control. 2.  Her ear canals, tympanic membranes, and middle ear spaces are normal. 3.  Subjectively stable bilateral high-frequency sensorineural hearing loss. 4.  Her tinnitus is likely a result of her hearing loss.  Plan: 1.  The physical exam findings are reviewed with the patient. 2.  The pathophysiology and symptoms of Mnire's disease are extensively discussed with the patient.  Questions are invited and answered. 3.  Continue with her 1500 mg low-salt diet. 4.  The strategies to cope with tinnitus, including the use of masker, hearing aids, tinnitus retraining therapy, and avoidance of caffeine and alcohol are discussed. 5.  The patient will return for reevaluation in 6 months, sooner if needed.

## 2023-11-11 NOTE — Progress Notes (Unsigned)
 SUBJECTIVE: Discussed the use of AI scribe software for clinical note transcription with the patient, who gave verbal consent to proceed.  Chief Complaint: Obesity  Interim History: She has maintained her weight since her last visit.  Down 16 lbs TBW loss of 5.6% Kara Wilkinson is here to discuss her progress with her obesity treatment plan. She is on the Category 2 Plan and states she is following her eating plan approximately 50 % of the time. She states she is not exercising. Kara Wilkinson is a 48 year old female who presents for follow-up of her obesity treatment plan.  She has a history of prediabetes, obstructive sleep apnea, and hypercholesterolemia with low HDL. She was recently restarted on metformin , half a tablet once daily with food, and reports experiencing severe cramps during her menstrual cycle since starting the medication. She is also taking ergocalciferol  50,000 units once weekly, Crestor  5 mg daily, and CoQ10 100 mg daily. She was prescribed zonisamide  25 mg daily for cravings but decided not to take it after reading about potential side effects.  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 This has impacted her ability to engage in physical activities such as walking or gardening. She recently adopted a stray cat, which has been a new addition to her household.  She is concerned about emotional stress eating and is considering strategies to cope with stress without resorting to food. She has not experienced other perimenopausal symptoms like night sweats, except for the severe cramps during her menstrual cycle. She wakes up once a night to use the bathroom but does not experience night sweats unless the cat sleeps on her.  She has lost 16 pounds but feels she is struggling with weight management due to stress and work  demands.  OBJECTIVE: Visit Diagnoses: Problem List Items Addressed This Visit     OSA (obstructive sleep apnea)   Morbid obesity (HCC)   Vitamin D  deficiency   Prediabetes - Primary   Low HDL (under 40)   Abnormal craving   Other Visit Diagnoses       Pure hypercholesterolemia         BMI 45.0-49.9, adult (HCC)Current BMI 45.2         Obesity with emotional stress eating Obesity with associated emotional stress eating due to increased stress from work and personal life changes. She has lost 16 pounds but struggles to maintain progress due to stress-related eating habits. She is hesitant to use medications like zonisamide  due to potential side effects and is considering therapy for stress management. - Refer to Dr. Soyla Fire for therapy to address emotional stress eating and develop coping strategies. - Discuss potential use of Zepbound  for weight management, noting cost and commitment involved.  Prediabetes Prediabetes managed with metformin  500 mg daily. Reports severe cramps during menstruation, possibly related to metformin  or perimenopausal changes. - Continue metformin  500 mg daily. - Monitor menstrual symptoms and consider discontinuing metformin  if symptoms persist.  Perimenopausal symptoms with dysmenorrhea Perimenopausal symptoms with dysmenorrhea. Reports severe cramps lasting two weeks during her cycle, possibly related to perimenopause or metformin  use. - Monitor symptoms and consider discontinuing metformin  if symptoms persist.  Hypercholesterolemia with low HDL Hypercholesterolemia with low HDL managed with Crestor  and CoQ10.  General Health Maintenance Discussed strategies for managing stress and emotional eating, including potential dietary changes and therapy. Emphasized the importance of protein  and fiber intake to manage hunger and support weight management. - Encourage intake of protein and fiber to manage hunger and support weight management. -  Consider using psyllium husk capsules or fiber supplements like Metamucil or Benefiber to increase fiber intake.  Vitals Temp: 98.6 F (37 C) BP: 139/82 Pulse Rate: (!) 56 SpO2: 100 %   Anthropometric Measurements Height: 5' 5 (1.651 m) Weight: 271 lb (122.9 kg) BMI (Calculated): 45.1 Weight at Last Visit: 271 lb Weight Lost Since Last Visit: 0 Weight Gained Since Last Visit: 0 Starting Weight: 287 lb Total Weight Loss (lbs): 16 lb (7.258 kg) Peak Weight: 287 lb   Body Composition  Body Fat %: 54.3 % Fat Mass (lbs): 147.4 lbs Muscle Mass (lbs): 118 lbs Visceral Fat Rating : 18   Other Clinical Data Fasting: No Labs: No Today's Visit #: 15 Starting Date: 09/11/22     ASSESSMENT AND PLAN:  Diet: Jannely is currently in the action stage of change. As such, her goal is to continue with weight loss efforts. She has agreed to Category 2 Plan.  Exercise: Sherronda has been instructed to work up to a goal of 150 minutes of combined cardio and strengthening exercise per week, to try a geriatric exercise plan, and that some exercise is better than none for weight loss and Wilkinson health benefits.   Behavior Modification:  We discussed the following Behavioral Modification Strategies today: increasing lean protein intake, decreasing simple carbohydrates, increasing vegetables, increase H2O intake, increase high fiber foods, no skipping meals, meal planning and cooking strategies, better snacking choices, emotional eating strategies , avoiding temptations, and planning for success. We discussed various medication options to help Jenisa with her weight loss efforts and we both agreed to continue metformin  and refer to Dr. Sharron for emotional eating strategies.  Return in about 6 weeks (around 12/24/2023).SABRA She was informed of the importance of frequent follow up visits to maximize her success with intensive lifestyle modifications for her multiple health  conditions.  Attestation Statements:   Reviewed by clinician on day of visit: allergies, medications, problem list, medical history, surgical history, family history, social history, and previous encounter notes.   Time spent on visit including pre-visit chart review and post-visit care and charting was *** minutes.    Makaiya Geerdes, PA-C

## 2023-11-12 ENCOUNTER — Encounter (INDEPENDENT_AMBULATORY_CARE_PROVIDER_SITE_OTHER): Payer: Self-pay | Admitting: Physician Assistant

## 2023-11-12 ENCOUNTER — Ambulatory Visit (INDEPENDENT_AMBULATORY_CARE_PROVIDER_SITE_OTHER): Admitting: Physician Assistant

## 2023-11-12 ENCOUNTER — Other Ambulatory Visit: Payer: Self-pay | Admitting: Internal Medicine

## 2023-11-12 VITALS — BP 139/82 | HR 56 | Temp 98.6°F | Ht 65.0 in | Wt 271.0 lb

## 2023-11-12 DIAGNOSIS — Z6841 Body Mass Index (BMI) 40.0 and over, adult: Secondary | ICD-10-CM

## 2023-11-12 DIAGNOSIS — R638 Other symptoms and signs concerning food and fluid intake: Secondary | ICD-10-CM

## 2023-11-12 DIAGNOSIS — N924 Excessive bleeding in the premenopausal period: Secondary | ICD-10-CM

## 2023-11-12 DIAGNOSIS — E786 Lipoprotein deficiency: Secondary | ICD-10-CM

## 2023-11-12 DIAGNOSIS — R7303 Prediabetes: Secondary | ICD-10-CM

## 2023-11-12 DIAGNOSIS — G4733 Obstructive sleep apnea (adult) (pediatric): Secondary | ICD-10-CM

## 2023-11-12 DIAGNOSIS — E7800 Pure hypercholesterolemia, unspecified: Secondary | ICD-10-CM | POA: Diagnosis not present

## 2023-11-12 DIAGNOSIS — E559 Vitamin D deficiency, unspecified: Secondary | ICD-10-CM

## 2023-11-25 ENCOUNTER — Telehealth (INDEPENDENT_AMBULATORY_CARE_PROVIDER_SITE_OTHER): Admitting: Psychology

## 2023-11-25 DIAGNOSIS — F419 Anxiety disorder, unspecified: Secondary | ICD-10-CM

## 2023-11-25 DIAGNOSIS — F5089 Other specified eating disorder: Secondary | ICD-10-CM | POA: Diagnosis not present

## 2023-11-25 NOTE — Progress Notes (Signed)
 Office: [phone]  /  Fax: [phone]    Date: November 25, 2023    Appointment Start Time: 12:09pm Duration: 63 minutes Provider: Wyatt Fire, Psy.D. Type of Session: Intake for Individual Therapy  Location of Patient: Home (private location) Location of Provider: Provider's home (private office) Type of Contact: Telepsychological Visit via MyChart Video Visit  Informed Consent: Prior to proceeding with today's appointment, two pieces of identifying information were obtained. In addition, Dariya's physical location at the time of this appointment was obtained as well a phone number she could be reached at in the event of technical difficulties. [place] and this provider participated in today's telepsychological service.   The provider's role was explained to [employer]. The provider reviewed and discussed issues of confidentiality, privacy, and limits therein (e.g., reporting obligations). In addition to verbal informed consent, written informed consent for psychological services was obtained prior to the initial appointment. Since the clinic is not a 24/7 crisis center, mental health emergency resources were shared and this  provider explained MyChart, e-mail, voicemail, and/or other messaging systems should be utilized only for non-emergency reasons. This provider also explained that information obtained during appointments will be placed in Alaysha's medical record and relevant information will be shared with other providers at [facility] at any locations for coordination of care. Aileene agreed information may be shared with other [facility] providers as needed for coordination of care and by signing the service agreement document, she provided written consent for coordination of care. Prior to initiating telepsychological services, Wm completed an informed consent document, which included the development of a safety plan (i.e., an emergency  contact and emergency resources) in the event of an emergency/crisis. Adeline verbally acknowledged understanding she is ultimately responsible for understanding her insurance benefits for telepsychological and in-person services. This provider also reviewed confidentiality, as it relates to telepsychological services. Ayssa  acknowledged understanding that appointments cannot be recorded without both party consent and she is aware she is responsible for securing confidentiality on her end of the session. Ayslin verbally consented to proceed.  Chief Complaint/HPI: Selah was referred by Elouise Brisker, PA-C on 11/12/2023 due to Obesity with emotional stress eating/abnormal cravings. Per the note for the OV, Obesity with associated emotional stress eating due to increased stress from work and personal life changes. She has lost 16 pounds but struggles to maintain progress due to stress-related eating habits. She is hesitant to use medications like zonisamide  due to potential side effects and is considering therapy for stress management.  During today's appointment, Tyshea was verbally administered a questionnaire assessing various behaviors related to emotional eating behaviors. Aigner endorsed the following: overeat when you are celebrating, experience food cravings on a regular basis, eat certain foods when you are anxious, stressed, depressed, or your feelings are hurt, use food to help you cope with emotional situations, find food is comforting to you, overeat when you are worried about something, overeat frequently when you are bored or lonely, not worry about what you eat when you are in a good mood, overeat when you are alone, but eat much less when you are with other people, eat to help you stay awake, and eat as a reward. She shared she craves sweets and some time salty foods. Srah believes the onset of emotional eating behaviors was likely early childhood secondary to her  grandfather's passing. She indicated she became a latchkey kid as her parents worked. She described the current frequency of emotional eating behaviors  as daily. In addition, Sosha denied a history of binge eating behaviors; however, she described grazing following lunch for approximately three hours when she feels out of control and/or is trying to avoid conflict/work tasks. Josslin denied a history of significantly restricting food intake, purging and engagement in other compensatory strategies for weight loss, and has never been diagnosed with an eating disorder. She also denied a history of treatment for emotional eating behaviors. Currently, Sawyer indicated she has to eat low sodium and avoid caffeine and artifical sweeteners due to Meniere's disease. Furthermore, Arron shared her husband has dietary restrictions secondary to medical concerns as wells.    Mental Status Examination:  Appearance: neat Behavior: appropriate to circumstances Mood: anxious Affect: mood congruent Speech: WNL Eye Contact: appropriate Psychomotor Activity: WNL Gait: unable to assess  Thought Process: linear, logical, and goal directed and denies suicidal, homicidal, and self-harm ideation, plan and intent  Thought Content/Perception: no hallucinations, delusions, bizarre thinking or behavior endorsed or observed Orientation: AAOx4 Memory/Concentration: intact Insight/Judgment: fair  Family & Psychosocial History: Bela reported she is married and she does not have any children. 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 Additionally, Tine shared her highest level of education obtained is BA in psychology. Currently, Amoura's social support system consists of her husband, sisters (2), and friends. Moreover, Kamron stated she resides with her husband and cat.   Medical History:  Past Medical History:  Diagnosis  Date   Anemia    Bilateral swelling of feet    Chronic headache    High cholesterol    Hyperlipidemia    Meniere's disease    Multiple food allergies    Sleep apnea    wear c pap   Sleep apnea    Past Surgical History:  Procedure Laterality Date   WISDOM TOOTH EXTRACTION     Current Outpatient Medications on File Prior to Visit  Medication Sig Dispense Refill   ALPRAZolam  (XANAX ) 0.25 MG tablet TAKE 1 TABLET BY MOUTH 2 TIMES DAILY AS NEEDED FOR vertigo 60 tablet 1   Coenzyme Q10 (CO Q 10) 100 MG CAPS Take 100 mg by mouth daily.     levonorgestrel (MIRENA) 20 MCG/24HR IUD 1 each by Intrauterine route once.     metFORMIN  (GLUCOPHAGE ) 500 MG tablet Take 1 tablet (500 mg total) by mouth daily. Start with 1/2 tablet once daily with food. 30 tablet 2   rosuvastatin  (CRESTOR ) 5 MG tablet Take 1 tablet (5 mg total) by mouth daily. 90 tablet 3   Vitamin D , Ergocalciferol , (DRISDOL ) 1.25 MG (50000 UNIT) CAPS capsule TAKE 1 CAPSULE BY MOUTH ONCE  WEEKLY 13 capsule 3   No current facility-administered medications on file prior to visit.  Melrose stated she is medication compliant.   Mental Health History: Naomii reported she has never attended therapeutic services. She stated she prescribed Xanax  PRN for vertigo episodes by her PCP, noting she last used it 8 months ago. Malaiyah reported there is no history of hospitalizations for psychiatric concerns. She endorsed a family history of depression (mother, father, and middle sister). Furthermore, Marabeth reported there is no history of trauma including psychological, physical , and sexual abuse, as well as neglect.   Donta described her typical mood lately as a little bit more optimistic. She disclosed experiencing crying spells secondary to worry associated with succeeding at work and the clinic. She also indicated experiencing memory issues following the second time she had COVID.  It was recommended she f/u with her PCP. Additionally,  Khamryn disclosed a history of nail biting and picking at scab, bumps, etc., which she described as mindless. Danese denied current alcohol use. She denied tobacco use. She denied illicit/recreational substance use. Furthermore, Delon indicated she is not experiencing the following: hallucinations and delusions, paranoia, symptoms of mania , social withdrawal, panic attacks, attention and concentration issues, and obsessions and compulsions. She also denied history of and current suicidal ideation, plan, and intent; history of and current homicidal ideation, plan, and intent; and history of and current engagement in self-harm.  Legal History: Gerda reported there is no history of legal involvement.   Structured Assessments Results: The Patient Health Questionnaire-9 (PHQ-9) is a self-report measure that assesses symptoms and severity of depression over the course of the last two weeks. Shonte obtained a score of 5 suggesting mild depression. Jaretzi finds the endorsed symptoms to be somewhat difficult. [0= Not at all; 1= Several days; 2= More than half the days; 3= Nearly every day] Little interest or pleasure in doing things 0  Feeling down, depressed, or hopeless 0  Trouble falling or staying asleep, or sleeping too much 1  Feeling tired or having little energy 3  Poor appetite or overeating 1  Feeling bad about yourself --- or that you are a failure or have let yourself or your family down 0  Trouble concentrating on things, such as reading the newspaper or watching television 0  Moving or speaking so slowly that other people could have noticed? Or the opposite --- being so fidgety or restless that you have been moving around a lot more than usual 0  Thoughts that you would be better off dead or hurting yourself in some way 0  PHQ-9 Score 5    The Generalized Anxiety Disorder-7 (GAD-7) is a brief self-report measure that assesses symptoms of anxiety over the course of the last two  weeks. Halo obtained a score of 12 suggesting moderate anxiety. Lindyn finds the endorsed symptoms to be not difficult at all. [0= Not at all; 1= Several days; 2= Over half the days; 3= Nearly every day] Feeling nervous, anxious, on edge 2  Not being able to stop or control worrying 0  Worrying too much about different things 3  Trouble relaxing 3  Being so restless that it's hard to sit still 3  Becoming easily annoyed or irritable 0  Feeling afraid as if something awful might happen- work related 1  GAD-7 Score 12   Interventions:  Conducted a chart review Focused on rapport building Verbally administered PHQ-9 and GAD-7 for symptom monitoring Verbally administered Food & Mood questionnaire to assess various behaviors related to emotional eating Provided emphatic reflections and validation Psychoeducation provided regarding physical versus emotional hunger   Diagnostic Impressions & Provisional DSM-5 Diagnosis(es): Aida endorsed a history of engagement in emotional eating behaviors and noted the onset as early childhood. She described the current frequency as daily. Rut denied engagement in any other disordered eating behaviors. Based on the aforementioned, the following diagnosis was assigned: F50.89 Other Specified Feeding or Eating Disorder, Emotional Eating Behaviors.Moreover, Mylei reported a history of anxiety-related symptoms resulting in avoidance behaviors, picking at skin, and biting nails. She also endorsed items on the GAD-7 during today's appointment. 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  Plan: Yarexi appears able and willing to participate as evidenced by engagement in reciprocal  conversation and asking questions as needed for clarification. The next appointment is scheduled for 12/09/2023 at 3pm, which will be via MyChart Video Visit.  The following treatment goal was established: increase coping skills. This provider will regularly review the treatment plan and medical chart to keep informed of status changes. Shahrzad expressed understanding and agreement with the initial treatment plan of care.   Beverlee will be sent a handout via e-mail to utilize between now and the next appointment to increase awareness of hunger patterns and subsequent eating. Delon provided verbal consent during today's appointment for this provider to send the handout via e-mail.    Wyatt Fire, PsyD

## 2023-12-09 ENCOUNTER — Telehealth (INDEPENDENT_AMBULATORY_CARE_PROVIDER_SITE_OTHER): Admitting: Psychology

## 2023-12-09 ENCOUNTER — Other Ambulatory Visit (INDEPENDENT_AMBULATORY_CARE_PROVIDER_SITE_OTHER): Payer: Self-pay | Admitting: Physician Assistant

## 2023-12-09 DIAGNOSIS — F419 Anxiety disorder, unspecified: Secondary | ICD-10-CM | POA: Diagnosis not present

## 2023-12-09 DIAGNOSIS — R7303 Prediabetes: Secondary | ICD-10-CM

## 2023-12-09 DIAGNOSIS — F5089 Other specified eating disorder: Secondary | ICD-10-CM | POA: Diagnosis not present

## 2023-12-09 NOTE — Progress Notes (Signed)
  Office: [phone]  /  Fax: [phone]    Date: December 09, 2023  Appointment Start Time: 3:07pm Duration: 28 minutes Provider: Wyatt Fire, Psy.D. Type of Session: Individual Therapy  Location of Patient: Home (private location) Location of Provider: Provider's Home (private office) Type of Contact: Telepsychological Visit via MyChart Video Visit  Session Content: Kara Wilkinson is a 48 y.o. female presenting for a follow-up appointment to address the previously established treatment goal of increasing coping skills. Today's appointment was a telepsychological visit. Kara Wilkinson provided verbal consent for today's telepsychological appointment and she is aware she is responsible for securing confidentiality on her end of the session. Prior to proceeding with today's appointment, Kara Wilkinson's physical location at the time of this appointment was obtained as well a phone number she could be reached at in the event of technical difficulties. [name] and this provider participated in today's telepsychological service.   This provider conducted a brief check-in. Kara Wilkinson shared, Things are going normally.  Regarding work, she feels there is not a whole lot [she is] in control of resulting in an increase in anxiety. Kara Wilkinson acknowledged feeling the aforementioned regarding her weight and eating habits as well. Further explored and processed. Currently, Kara Wilkinson stated that if she does not plan, she experiences challenges eating congruent  to her prescribed structured meal plan. Explored reality vs. expectations regarding planning. Additionally, psychoeducation provided regarding habit stacking. Explored current habits and habits she wants to implement as it relates to her eating. Focused on making stacks to help her meet her goals with the clinic. Of note, Kara Wilkinson shared after starting metformin  she started experiencing a period and severe PMS symptoms. She agreed to contact her prescribing provider  after this appointment. Overall, Kara Wilkinson was receptive to today's appointment as evidenced by openness to sharing, responsiveness to feedback, and willingness to implement discussed strategies .  Mental Status Examination:  Appearance: neat Behavior: appropriate to circumstances Mood: anxious Affect: mood congruent Speech: WNL Eye Contact: appropriate Psychomotor Activity: WNL Gait: unable to assess Thought Process: linear, logical, and goal directed and no evidence or endorsement of suicidal, homicidal, and self-harm ideation, plan and intent  Thought Content/Perception: no hallucinations, delusions, bizarre thinking or behavior endorsed or observed Orientation: AAOx4 Memory/Concentration: intact Insight: fair Judgment: fair  Interventions:  Conducted a brief chart review Provided empathic reflections and validation Provided positive reinforcement Engaged patient in problem solving Psychoeducation provided regarding habit stacks   DSM-5 Diagnosis(es): F50.89 Other Specified Feeding or Eating Disorder, Emotional Eating Behaviors and 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 Progress is limited, as [name] has just begun treatment with this provider; however, she is receptive to the interaction and interventions and rapport is being established.   Plan: The next appointment is scheduled for 12/30/2023 at 4pm, which will be via MyChart Video Visit. The next session will focus on working towards the established treatment goal.   Wyatt Fire, PsyD

## 2023-12-29 ENCOUNTER — Ambulatory Visit (INDEPENDENT_AMBULATORY_CARE_PROVIDER_SITE_OTHER): Admitting: Physician Assistant

## 2023-12-29 ENCOUNTER — Encounter (INDEPENDENT_AMBULATORY_CARE_PROVIDER_SITE_OTHER): Payer: Self-pay | Admitting: Physician Assistant

## 2023-12-29 VITALS — BP 142/84 | HR 61 | Temp 98.1°F | Ht 65.0 in | Wt 274.0 lb

## 2023-12-29 DIAGNOSIS — E785 Hyperlipidemia, unspecified: Secondary | ICD-10-CM

## 2023-12-29 DIAGNOSIS — R7303 Prediabetes: Secondary | ICD-10-CM

## 2023-12-29 DIAGNOSIS — F5089 Other specified eating disorder: Secondary | ICD-10-CM

## 2023-12-29 DIAGNOSIS — E786 Lipoprotein deficiency: Secondary | ICD-10-CM

## 2023-12-29 DIAGNOSIS — Z6841 Body Mass Index (BMI) 40.0 and over, adult: Secondary | ICD-10-CM

## 2023-12-29 DIAGNOSIS — G4733 Obstructive sleep apnea (adult) (pediatric): Secondary | ICD-10-CM | POA: Diagnosis not present

## 2023-12-29 DIAGNOSIS — N924 Excessive bleeding in the premenopausal period: Secondary | ICD-10-CM

## 2023-12-29 NOTE — Progress Notes (Signed)
 SUBJECTIVE: Discussed the use of AI scribe software for clinical note transcription with the patient, who gave verbal consent to proceed.  Chief Complaint: Obesity  Interim History: She is up 3 lbs since her last visit.  Down 13 lbs overall TBW loss of 4.53%  Kara Wilkinson is here to discuss her progress with her obesity treatment plan. She is on the Category 3 Plan and states she is following her eating plan approximately 60 % of the time. She states she is exercising yard work 60+minutes 3 times per week. Kara Wilkinson is a 48 year old female who presents for follow-up on her obesity treatment plan.  She is experiencing difficulty maintaining muscle mass and has noted a slight increase in weight. Despite engaging in physical activities like yard work and cleaning, she finds it challenging to manage her weight.  She stopped taking metformin  after her last menstrual period due to severe cramping. Her current menstrual cycle started today, with noticeable ankle edema but no cramping, which is an improvement from previous cycles. She experiences significant pain for at least a week each month when on metformin , which she finds intolerable.  She is attempting to manage her diet with a focus on high protein intake. Today, she consumed a breakfast burrito with meat and cheese, a Lean Cuisine meal with 23 grams of protein, an apple, and a handful of Garrel and Ikes. She recently acquired an air fryer and is experimenting with cooking methods. Despite her efforts, she feels she is consuming too many carbohydrates and is frustrated by her husband's weight loss on Wegovy  while she continues to gain weight.  She reports persistent thirst and frequent nighttime urination. Her A1c improved from 6 to 5.4 previously, but she is concerned it may be increasing again. She is currently taking Crestor  for cholesterol and CoQ10 for joint pain, which has been effective.  She has a history of ovarian cysts, with two  cysts having burst in the past year.  She is working on behavioral aspects of her eating habits, recognizing that she uses food as an avoidance mechanism. She is exploring alternative activities such as painting and singing to manage stress and avoid emotional eating.   Plan fasting IC and labs next OV  OBJECTIVE: Visit Diagnoses: Problem List Items Addressed This Visit     OSA (obstructive sleep apnea)   Morbid obesity (HCC)   Prediabetes - Primary   Low HDL (under 40)   Other Visit Diagnoses       Other Specified Feeding or Eating Disorder, Emotional Eating Behaviors         Perimenopausal menorrhagia         BMI 45.0-49.9, adult (HCC) Current BMI 45.7            Obesity Obesity with challenges in weight management despite dietary control and physical activity. Previous metformin  use discontinued due to severe menstrual cramping. Potential future use of oral Wegovy  pending FDA approval. Consideration of naltrexone for weight management, with noted side effect of nausea. Current dietary intake includes approximately 50 grams of protein daily, below the recommended 100 grams. Frustration with weight gain despite similar dietary habits to her husband, who is on Wegovy . Discussion of potential benefits of Zepbound  but does not have insurance coverage for OSA.  - Increase protein intake to 100 grams per day. - Maintain calorie intake between 1500-1600 calories per day. - Consider naltrexone for weight management if interested. - Reassess potential use of oral Wegovy  pending FDA approval. -  Encourage non-food related stress management activities.  Prediabetes Prediabetes with previous A1c improved from 6.0 to 5.4. Concerns about potential worsening of glycemic control due to increased thirst and nocturia. Discussion of potential use of GLP-1 receptor agonists if A1c worsens, as they are effective in weight loss and glycemic control. - Order fasting labs including A1c, kidney function,  and lipids. - Schedule fasting metabolism test    Other specified eating disorder Discussion of eating habits and emotional eating patterns. Uses food as a coping mechanism for stress. Currently working with Dr. Sharron on behavioral strategies. - Continue regular appointments with Dr. Sharron every two weeks. - Encourage exploration of non-food related activities for stress management.  Hyperlipidemia Hyperlipidemia managed with Crestor , with good tolerance and improved joint symptoms with CoQ10 supplementation. - Continue Crestor  and CoQ10 supplementation. - Monitor lipid levels during fasting labs.  Obstructive sleep apnea Obstructive sleep apnea managed with CPAP. Reports persistent dry mouth, possibly related to mouth breathing during CPAP use. - Evaluate hydration status and encourage adequate fluid intake. Continue prescribed CPAP  Excessive bleeding in the premenopausal period Excessive menstrual bleeding with associated ankle edema. History of severe cramping leading to discontinuation of metformin . Current cycle noted to have less cramping but significant edema. - Monitor menstrual symptoms and manage with Tylenol  as needed. - Follow up with Gyn for ongoing concerns. Likely in peri-menopause.   Anthropometric Measurements Height: 5' 5 (1.651 m) Weight: 274 lb (124.3 kg) BMI (Calculated): 45.6 Weight at Last Visit: 271 lb Weight Lost Since Last Visit: 0 Weight Gained Since Last Visit: 3 lb Starting Weight: 287 lb Total Weight Loss (lbs): 13 lb (5.897 kg) Peak Weight: 287 lb   Body Composition  Body Fat %: 55.4 % Fat Mass (lbs): 152 lbs Muscle Mass (lbs): 116.2 lbs Visceral Fat Rating : 18   Other Clinical Data Fasting: No Labs: No Today's Visit #: 16 Starting Date: 09/11/22     ASSESSMENT AND PLAN:  Diet: Kara Wilkinson is currently in the action stage of change. As such, her goal is to continue with weight loss efforts. She has agreed to Category 3  Plan.  Exercise: Kara Wilkinson has been instructed to work up to a goal of 150 minutes of combined cardio and strengthening exercise per week, to try a geriatric exercise plan, and that some exercise is better than none for weight loss and overall health benefits.   Behavior Modification:  We discussed the following Behavioral Modification Strategies today: increasing lean protein intake, decreasing simple carbohydrates, increasing vegetables, increase H2O intake, decreasing sodium intake, increase high fiber foods, meal planning and cooking strategies, emotional eating strategies , avoiding temptations, and planning for success. We discussed various medication options to help Kara Wilkinson with her weight loss efforts and we both agreed to continue current treatment plan.  Return in about 4 weeks (around 01/26/2024) for Fasting Lab, Fasting IC.Kara Wilkinson She was informed of the importance of frequent follow up visits to maximize her success with intensive lifestyle modifications for her multiple health conditions.  Attestation Statements:   Reviewed by clinician on day of visit: allergies, medications, problem list, medical history, surgical history, family history, social history, and previous encounter notes.   Time spent on visit including pre-visit chart review and post-visit care and charting was 39 minutes.    Orene Abbasi, PA-C

## 2023-12-30 ENCOUNTER — Telehealth (INDEPENDENT_AMBULATORY_CARE_PROVIDER_SITE_OTHER): Admitting: Psychology

## 2023-12-30 DIAGNOSIS — F419 Anxiety disorder, unspecified: Secondary | ICD-10-CM | POA: Diagnosis not present

## 2023-12-30 DIAGNOSIS — F5089 Other specified eating disorder: Secondary | ICD-10-CM

## 2023-12-30 NOTE — Progress Notes (Signed)
  Office: [phone]  /  Fax: [phone]    Date: December 30, 2023  Appointment Start Time: 4:02pm Duration: 27 minutes Provider: Wyatt Fire, Psy.D. Type of Session: Individual Therapy  Location of Patient: Home (private location) Location of Provider: Provider's Home (private office) Type of Contact: Telepsychological Visit via MyChart Video Visit  Session Content: Kara Wilkinson is a 48 y.o. female presenting for a follow-up appointment to address the previously established treatment goal of increasing coping skills. Today's appointment was a telepsychological visit. Kara Wilkinson provided verbal consent for today's telepsychological appointment and she is aware she is responsible for securing confidentiality on her end of the session. Prior to proceeding with today's appointment, Kara Wilkinson's physical location at the time of this appointment was obtained as well a phone number she could be reached at in the event of technical difficulties. Kara Wilkinson and this provider participated in today's telepsychological service.   This provider conducted a brief check-in. Kara Wilkinson discussed focusing on developed habit stacks, noting she is standing up and moving more during the work day, noting she will have a drink of water during that break.  Assessed recent engagement in emotional eating behaviors. Kara Wilkinson acknowledged, I've been struggling quite a bit with that. Psychoeducation regarding triggers for emotional eating was provided. Kara Wilkinson was provided a handout, and encouraged to utilize the handout between now and the next appointment to increase awareness of triggers and frequency. Kara Wilkinson agreed. This provider also discussed behavioral strategies for specific triggers, such as placing the utensil down when conversing to avoid mindless eating. Kara Wilkinson provided verbal consent during today's appointment for this provider to send a handout about triggers via e-mail. Overall, Kara Wilkinson was receptive to  today's appointment as evidenced by openness to sharing, responsiveness to feedback, and willingness to explore triggers for emotional eating.  Mental Status Examination:  Appearance: neat Behavior: appropriate to circumstances Mood: anxious Affect: mood congruent Speech: WNL Eye Contact: appropriate Psychomotor Activity: WNL Gait: unable to assess Thought Process: linear, logical, and goal directed and no evidence or endorsement of suicidal, homicidal, and self-harm ideation, plan and intent  Thought Content/Perception: no hallucinations, delusions, bizarre thinking or behavior endorsed or observed Orientation: AAOx4 Memory/Concentration: intact Insight: fair Judgment: fair  Interventions:  Conducted a brief chart review Provided empathic reflections and validation Reviewed content from the previous session Provided positive reinforcement Employed supportive psychotherapy interventions to facilitate reduced distress and to improve coping skills with identified stressors Psychoeducation provided regarding triggers for emotional eating behaviors  DSM-5 Diagnosis(es): F50.89 Other Specified Feeding or Eating Disorder, Emotional Eating Behaviors and 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 Kara Wilkinson has demonstrated progress in her goal as evidenced by increased awareness of hunger patterns. Kara Wilkinson also continues to demonstrate willingness to engage in learned skill(s).  Plan: The next appointment is scheduled for 01/12/2024 at 2:30pm, which will be via MyChart Video Visit. The next session will focus on working towards the established treatment goal.   Wyatt Fire, PsyD

## 2024-01-12 ENCOUNTER — Telehealth (INDEPENDENT_AMBULATORY_CARE_PROVIDER_SITE_OTHER): Admitting: Psychology

## 2024-01-12 DIAGNOSIS — F5089 Other specified eating disorder: Secondary | ICD-10-CM | POA: Diagnosis not present

## 2024-01-12 DIAGNOSIS — F419 Anxiety disorder, unspecified: Secondary | ICD-10-CM

## 2024-01-12 NOTE — Progress Notes (Signed)
  Office: [phone]  /  Fax: [phone]    Date: January 12, 2024  Appointment Start Time: 2:30pm Duration: 29 minutes Provider: Wyatt Fire, Psy.D. Type of Session: Individual Therapy  Location of Patient: Home (private location) Location of Provider: Provider's Home (private office) Type of Contact: Telepsychological Visit via MyChart Video Visit  Session Content: Kara Wilkinson is a 48 y.o. female presenting for a follow-up appointment to address the previously established treatment goal of increasing coping skills. Today's appointment was a telepsychological visit. Kara Wilkinson provided verbal consent for today's telepsychological appointment and she is aware she is responsible for securing confidentiality on her end of the session. Prior to proceeding with today's appointment, Kara Wilkinson's physical location at the time of this appointment was obtained as well a phone number she could be reached at in the event of technical difficulties. Edit and this provider participated in today's telepsychological service.    This provider conducted a brief check-in. Kara Wilkinson described reflecting on her triggers for emotional eating behaviors. She noted increased awareness of her eating habits has resulted in a reduction in emotional eating behaviors. She also discussed using her exercise bike. Kara Wilkinson was engaged in problem solving to develop a plan to help cope with urges/cravings involving activities to relax (e.g., read), activities to distract (e.g., pulling weeds, gardening), comforting places (e.g., front porch, back garden), people to call and connect with, and activities that help soothe senses (e.g., petting Honeygo). She was observed writing the plan. Overall, Kara Wilkinson was receptive to today's appointment as evidenced by openness to sharing, responsiveness to feedback, and willingness to implement discussed strategies .  Mental Status Examination:  Appearance: neat Behavior: appropriate to  circumstances Mood: neutral Affect: mood congruent Speech: WNL Eye Contact: appropriate Psychomotor Activity: WNL Gait: unable to assess Thought Process: linear, logical, and goal directed and no evidence or endorsement of suicidal, homicidal, and self-harm ideation, plan and intent  Thought Content/Perception: no hallucinations, delusions, bizarre thinking or behavior endorsed or observed Orientation: AAOx4 Memory/Concentration: intact Insight: good Judgment: good  Interventions:  Conducted a brief chart review Provided empathic reflections and validation Reviewed content from the previous session Provided positive reinforcement Employed supportive psychotherapy interventions to facilitate reduced distress and to improve coping skills with identified stressors Engaged patient in problem solving  DSM-5 Diagnosis(es): F50.89 Other Specified Feeding or Eating Disorder, Emotional Eating Behaviors and 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 Kara Wilkinson has demonstrated progress in her goal as evidenced by increased awareness of hunger patterns, increased awareness of triggers for emotional eating behaviors, and reduction in emotional eating behaviors . Kara Wilkinson also continues to demonstrate willingness to engage in learned skill(s).  Plan: The next appointment is scheduled for 02/02/2024 at 2:30pm, which will be via MyChart Video Visit. The next session will focus on working towards the established treatment goal.   Wyatt Fire, PsyD

## 2024-01-26 NOTE — Progress Notes (Unsigned)
 SUBJECTIVE: Discussed the use of AI scribe software for clinical note transcription with the patient, who gave verbal consent to proceed.  Chief Complaint: Obesity  Interim History: She is up 2 lbs since her last visit.  Down 11 lbs overall TBW loss of 3.8 %  Kara Wilkinson is here to discuss her progress with her obesity treatment plan. She is on the Category 3 Plan and states she is following her eating plan approximately 70 % of the time. She states she is exercising bike riding 30 minutes 3 times per week.  Kara Wilkinson is a 48 year old female with obesity who presents for follow-up of her obesity treatment plan.  She is currently on a category three obesity treatment plan and adheres to it approximately 70% of the time. Since her last visit, she has gained two pounds, with an increase in muscle mass by 1.8 pounds and adipose by 0.6 pounds. Her body adipose percentage is 55.1% and her visceral adipose rating is 18.  She is trying to eat more whole foods and gets the recommended amount of protein 90% of the time. She drinks adequate water and does not skip meals.  She underwent repeat indirect calorimetry testing today, which showed a resting energy expenditure (REE) of 1786 calories, higher than her previous REE but still slightly lower than predicted. She is also undergoing fasting labs today.  She has obstructive sleep apnea and sleeps nine hours per night using her CPAP machine. She has been riding her bike for 30 minutes three times per week.  Pharmacotherapy:  She has tried several medications in the past for her condition. Metformin  caused her periods to become irregular and did not make a significant difference. Topiramate  was associated with headaches.   She has not tried Contrave before. She does not get headaches regularly, except for rare migraines or when she experienced headaches during a past trial of topiramate . We discussed use of Contrave today. No hx of seizure  disorder. No hx of glaucoma. Not on antidiabetic medications. Discussed potential side effects- nausea, HA, constipation, Insomnia and dry mouth being most common potential side effects. She wishes to move forward with Contrave.    Indirect calorimetry was repeated today.  She underwent repeat indirect calorimetry testing today, which showed a resting energy expenditure (REE) of 1786 calories, higher than her previous REE but still slightly lower than predicted. 09/11/22 results: Indirect Calorimeter completed today shows a VO2 of 236 and a REE of 1627.  Her calculated basal metabolic rate is 8101 thus her basal metabolic rate is worse than expected.   Fasting labs obtained today.  The patient was informed we would discuss the lab results at the next visit unless there is a critical issue that needs to be addressed sooner. The patient agreed to keep the next visit at the agreed upon time to discuss these results.   OBJECTIVE: Visit Diagnoses: Problem List Items Addressed This Visit     OSA (obstructive sleep apnea)   Morbid obesity (HCC)   Relevant Medications   Naltrexone-buPROPion HCl ER (CONTRAVE) 8-90 MG TB12   Vitamin D  deficiency   Relevant Orders   VITAMIN D  25 Hydroxy (Vit-D Deficiency, Fractures)   Prediabetes   Relevant Orders   CMP14+EGFR   Hemoglobin A1c   Insulin , random   Vitamin B12 deficiency   Relevant Orders   Vitamin B12   CBC with Differential/Platelet   Low HDL (under 40)   Relevant Orders   Lipid Panel With LDL/HDL Ratio  Other Visit Diagnoses       SOBOE (shortness of breath on exertion)    -  Primary   Relevant Orders   TSH     Perimenopausal menorrhagia         BMI 45.0-49.9, adult (HCC) Current BMI 46.1       Relevant Medications   Naltrexone-buPROPion HCl ER (CONTRAVE) 8-90 MG TB12     SOBOE Kara Wilkinson notes increasing shortness of breath with exercising and seems to be worsening over time with weight gain. She notes getting out of breath sooner  with activity than she used to. This has not gotten worse recently. Kara Wilkinson denies shortness of breath at rest or orthopnea.  She underwent repeat indirect calorimetry testing today, which showed a resting energy expenditure (REE) of 1786 calories, higher than her previous REE at 1627 by 159 calories, but still slightly lower than predicted. Plan:  Current nutrition plan appropriate for caloric and protein needs overall Discussed switching to journaling plan with 1500-1600 calories and 100+ grams of protein daily and can use app like MyFitnessPAL or Lose it or journal on paper with goal of journaling at least 3-4 days per week.    Obesity Obesity with a slight increase in weight since the last visit, with a gain of 1.8 pounds in muscle mass and 0.6 pounds in adipose tissue. Current body adipose percentage is 55.1% and visceral adipose rating is 18. Metabolic rate has improved slightly, with an REE of 1786 calories, but still lower than predicted. She is following a category three plan approximately 70% of the time and is consuming adequate protein and water. She is engaging in physical activity by riding her bike three times a week. Previous medications like metformin  and topiramate  were not well tolerated due to side effects. Discussed potential use of Contrave (naltrexone/bupropion) to aid in weight loss, with nausea as a potential side effect. Weight loss with Contrave is usually around 8%. Discussed other options like phentermine, which is a stimulant and may cause anxiety, and the potential future availability of oral Wegovy . - Start Contrave 1 tablet daily for 14 days, then increase to 1 tablet twice daily if tolerated - Order fasting labs including thyroid function, B12, chemistries, liver and kidney function, complete blood count, lipids, insulin , and A1c - Encourage continuation of current exercise regimen and consider increasing frequency to 5 days a week - Encourage journaling of food intake  and protein consumption using an app like Lose It - Maintain current caloric intake around 1500-1600 calories per day with a goal of 100-110 grams of protein daily  Perimenopause Perimenopausal symptoms affecting metabolism. Discussed the impact of perimenopause on weight management and metabolism.  Obstructive sleep apnea Obstructive sleep apnea managed with CPAP. She is sleeping approximately 7- 9 hours per night with CPAP use. Using Hypno app to improve time to getting to sleep.   Prediabetes Did not tolerate metformin  in past as noted.  Lab Results  Component Value Date   HGBA1C 5.4 02/12/2023   HGBA1C 6.0 (H) 09/11/2022   Lab Results  Component Value Date   LDLCALC 73 06/30/2023   CREATININE 0.78 02/12/2023   INSULIN   Date Value Ref Range Status  02/12/2023 21.8 2.6 - 24.9 uIU/mL Final   ]Continue working on nutrition plan to decrease simple carbohydrates, increase lean proteins and exercise to promote weight loss, improve glycemic control and prevent progression to Type 2 diabetes.  - Order fasting labs including insulin  and A1c and CMET today.   Hyperlipidemia with  low HDL cholesterol Hyperlipidemia with low HDL cholesterol managed with lifestyle modifications and monitoring through fasting labs. LDL ~ at goal. Trig at goal. HDL still < 40.  On rosuvastatin  5 mg daily. No reported SE.  On CoQ 100mg  daily  Last lipids Lab Results  Component Value Date   CHOL 128 06/30/2023   HDL 35 (L) 06/30/2023   LDLCALC 73 06/30/2023   TRIG 123 06/30/2023   CHOLHDL 3.7 06/30/2023  Continue to work on nutrition plan -decreasing simple carbohydrates, increasing lean proteins, decreasing saturated fats and cholesterol , avoiding trans fats and exercise as able to promote weight loss, improve lipids and decrease cardiovascular risks. - Order fasting lipid panel   Vitamin D  Deficiency Vitamin D  is at goal of 50.  Most recent vitamin D  level was 82. She is on no vitamin D  at this  time. Energy levels +/-. Ongoing stressors.  Lab Results  Component Value Date   VD25OH 82 06/30/2023   VD25OH 56.9 02/12/2023   VD25OH 10.7 (L) 09/11/2022    Plan:  Recheck vitamin D  level today and supplementation if needed.  Low vitamin D  levels can be associated with adiposity and may result in leptin resistance and weight gain. Also associated with fatigue.  Currently on vitamin D  supplementation without any adverse effects such as nausea, vomiting or muscle weakness.   Hx of low B 12 Lab Results  Component Value Date   VITAMINB12 372 02/12/2023  Energy levels +/-. Significant ongoing stressors.   Plan: Recheck B 12 level and supplementation if indicated.  Also check Vitamin D  and TSH.    Vitals Temp: 98.5 F (36.9 C) BP: 133/83 Pulse Rate: 60 SpO2: 99 %   Anthropometric Measurements Height: 5' 5 (1.651 m) Weight: 276 lb (125.2 kg) BMI (Calculated): 45.93 Weight at Last Visit: 274 lb Weight Lost Since Last Visit: 0 Weight Gained Since Last Visit: 2 lb Starting Weight: 287 lb Total Weight Loss (lbs): 11 lb (4.99 kg) Peak Weight: 287 lb   Body Composition  Body Fat %: 55.1 % Fat Mass (lbs): 152.6 lbs Muscle Mass (lbs): 118 lbs Visceral Fat Rating : 18   Other Clinical Data RMR: [id number] Fasting: Yes Labs: Yes Today's Visit #: 17 Starting Date: 09/11/22 Comments: IC DONE TODAY     ASSESSMENT AND PLAN:  Diet: Kara Wilkinson is currently in the action stage of change. As such, her goal is to continue with weight loss efforts. She has agreed to Category 3 Plan and keeping a food journal and adhering to recommended goals of 1500-1600 calories and 100+ grams of  protein.  Exercise: Brittish has been instructed to work up to a goal of 150 minutes of combined cardio and strengthening exercise per week and to continue exercising as is for weight loss and overall health benefits.   Behavior Modification:  We discussed the following Behavioral Modification  Strategies today: increasing lean protein intake, decreasing simple carbohydrates, increasing vegetables, increase H2O intake, increase high fiber foods, meal planning and cooking strategies, avoiding temptations, planning for success, and keep a strict food journal. We discussed various medication options to help Leni with her weight loss efforts and we both agreed to trial of Contrave for medication weight loss with gradual titration.  Return in about 4 weeks (around 02/24/2024).SABRA She was informed of the importance of frequent follow up visits to maximize her success with intensive lifestyle modifications for her multiple health conditions.  Attestation Statements:   Reviewed by clinician on day of visit: allergies, medications,  problem list, medical history, surgical history, family history, social history, and previous encounter notes.   Time spent on visit including pre-visit chart review and post-visit care and charting was 40 minutes.    Dannielle Baskins, PA-C

## 2024-01-27 ENCOUNTER — Other Ambulatory Visit (INDEPENDENT_AMBULATORY_CARE_PROVIDER_SITE_OTHER): Payer: Self-pay | Admitting: Physician Assistant

## 2024-01-27 ENCOUNTER — Encounter (INDEPENDENT_AMBULATORY_CARE_PROVIDER_SITE_OTHER): Payer: Self-pay | Admitting: Physician Assistant

## 2024-01-27 ENCOUNTER — Ambulatory Visit (INDEPENDENT_AMBULATORY_CARE_PROVIDER_SITE_OTHER): Admitting: Physician Assistant

## 2024-01-27 VITALS — BP 133/83 | HR 60 | Temp 98.5°F | Ht 65.0 in | Wt 276.0 lb

## 2024-01-27 DIAGNOSIS — G4733 Obstructive sleep apnea (adult) (pediatric): Secondary | ICD-10-CM

## 2024-01-27 DIAGNOSIS — E538 Deficiency of other specified B group vitamins: Secondary | ICD-10-CM | POA: Diagnosis not present

## 2024-01-27 DIAGNOSIS — E559 Vitamin D deficiency, unspecified: Secondary | ICD-10-CM | POA: Diagnosis not present

## 2024-01-27 DIAGNOSIS — R7303 Prediabetes: Secondary | ICD-10-CM | POA: Diagnosis not present

## 2024-01-27 DIAGNOSIS — R0602 Shortness of breath: Secondary | ICD-10-CM | POA: Diagnosis not present

## 2024-01-27 DIAGNOSIS — E786 Lipoprotein deficiency: Secondary | ICD-10-CM

## 2024-01-27 DIAGNOSIS — N924 Excessive bleeding in the premenopausal period: Secondary | ICD-10-CM

## 2024-01-27 DIAGNOSIS — Z6841 Body Mass Index (BMI) 40.0 and over, adult: Secondary | ICD-10-CM

## 2024-01-27 MED ORDER — CONTRAVE 8-90 MG PO TB12: ORAL_TABLET | ORAL | 0 refills | Status: DC

## 2024-01-27 MED ORDER — NALTREXONE-BUPROPION HCL ER 8-90 MG PO TB12: ORAL_TABLET | ORAL | 0 refills | Status: DC

## 2024-01-28 ENCOUNTER — Telehealth (INDEPENDENT_AMBULATORY_CARE_PROVIDER_SITE_OTHER): Payer: Self-pay | Admitting: Physician Assistant

## 2024-01-28 ENCOUNTER — Encounter: Payer: Self-pay | Admitting: Internal Medicine

## 2024-01-28 LAB — CBC WITH DIFFERENTIAL/PLATELET
Basophils Absolute: 0 x10E3/uL (ref 0.0–0.2)
Basos: 1 %
EOS (ABSOLUTE): 0.1 x10E3/uL (ref 0.0–0.4)
Eos: 1 %
Hematocrit: 39.7 % (ref 34.0–46.6)
Hemoglobin: 12.9 g/dL (ref 11.1–15.9)
Immature Grans (Abs): 0 x10E3/uL (ref 0.0–0.1)
Immature Granulocytes: 0 %
Lymphocytes Absolute: 2.2 x10E3/uL (ref 0.7–3.1)
Lymphs: 29 %
MCH: 31.1 pg (ref 26.6–33.0)
MCHC: 32.5 g/dL (ref 31.5–35.7)
MCV: 96 fL (ref 79–97)
Monocytes Absolute: 0.4 x10E3/uL (ref 0.1–0.9)
Monocytes: 6 %
Neutrophils Absolute: 4.9 x10E3/uL (ref 1.4–7.0)
Neutrophils: 63 %
Platelets: 253 x10E3/uL (ref 150–450)
RBC: 4.15 x10E6/uL (ref 3.77–5.28)
RDW: 12.8 % (ref 11.7–15.4)
WBC: 7.6 x10E3/uL (ref 3.4–10.8)

## 2024-01-28 LAB — HEMOGLOBIN A1C
Est. average glucose Bld gHb Est-mCnc: 114 mg/dL
Hgb A1c MFr Bld: 5.6 % (ref 4.8–5.6)

## 2024-01-28 LAB — CMP14+EGFR
ALT: 23 IU/L (ref 0–32)
AST: 18 IU/L (ref 0–40)
Albumin: 4 g/dL (ref 3.9–4.9)
Alkaline Phosphatase: 84 IU/L (ref 41–116)
BUN/Creatinine Ratio: 14 (ref 9–23)
BUN: 11 mg/dL (ref 6–24)
Bilirubin Total: 1.2 mg/dL (ref 0.0–1.2)
CO2: 21 mmol/L (ref 20–29)
Calcium: 8.7 mg/dL (ref 8.7–10.2)
Chloride: 102 mmol/L (ref 96–106)
Creatinine, Ser: 0.81 mg/dL (ref 0.57–1.00)
Globulin, Total: 2.9 g/dL (ref 1.5–4.5)
Glucose: 87 mg/dL (ref 70–99)
Potassium: 4.1 mmol/L (ref 3.5–5.2)
Sodium: 139 mmol/L (ref 134–144)
Total Protein: 6.9 g/dL (ref 6.0–8.5)
eGFR: 88 mL/min/1.73 (ref >59)

## 2024-01-28 LAB — INSULIN, RANDOM: INSULIN: 28 u[IU]/mL — ABNORMAL HIGH (ref 2.6–24.9)

## 2024-01-28 LAB — LIPID PANEL WITH LDL/HDL RATIO
Cholesterol, Total: 155 mg/dL (ref 100–199)
HDL: 35 mg/dL — ABNORMAL LOW (ref >39)
LDL Chol Calc (NIH): 95 mg/dL (ref 0–99)
LDL/HDL Ratio: 2.7 ratio (ref 0.0–3.2)
Triglycerides: 142 mg/dL (ref 0–149)
VLDL Cholesterol Cal: 25 mg/dL (ref 5–40)

## 2024-01-28 LAB — TSH: TSH: 4.34 u[IU]/mL (ref 0.450–4.500)

## 2024-01-28 LAB — VITAMIN D 25 HYDROXY (VIT D DEFICIENCY, FRACTURES): Vit D, 25-Hydroxy: 48 ng/mL (ref 30.0–100.0)

## 2024-01-28 LAB — VITAMIN B12: Vitamin B-12: 528 pg/mL (ref 232–1245)

## 2024-01-28 NOTE — Telephone Encounter (Signed)
 Good afternoon!  The medication that was prescribed is expensive (Contrave, $250) and patient said that Kara Wilkinson said that prescribing that two separate medications instead could be more affordable. She would like to try that option. She would like it sent to [facility] pharmacy on [address]. She is okay with a Mychart message in response.   Thanks!

## 2024-02-02 ENCOUNTER — Other Ambulatory Visit (INDEPENDENT_AMBULATORY_CARE_PROVIDER_SITE_OTHER): Payer: Self-pay | Admitting: Physician Assistant

## 2024-02-02 ENCOUNTER — Telehealth (INDEPENDENT_AMBULATORY_CARE_PROVIDER_SITE_OTHER): Admitting: Psychology

## 2024-02-02 DIAGNOSIS — F419 Anxiety disorder, unspecified: Secondary | ICD-10-CM

## 2024-02-02 DIAGNOSIS — F5089 Other specified eating disorder: Secondary | ICD-10-CM | POA: Diagnosis not present

## 2024-02-02 MED ORDER — BUPROPION HCL ER (SR) 100 MG PO TB12: 100.0000 mg | ORAL_TABLET | Freq: Every day | ORAL | 1 refills | Status: DC

## 2024-02-02 MED ORDER — NALTREXONE HCL 50 MG PO TABS: 25.0000 mg | ORAL_TABLET | Freq: Every day | ORAL | 0 refills | Status: DC

## 2024-02-02 NOTE — Progress Notes (Signed)
  Office: [phone]  /  Fax: [phone]    Date: February 02, 2024  Appointment Start Time: 2:36pm Duration: 30 minutes Provider: Wyatt Fire, Psy.D. Type of Session: Individual Therapy  Location of Patient: Home (private location) Location of Provider: [facility] clinic at [facility]  Type of Contact: Telepsychological Visit via MyChart Video Visit  Session Content: Kara Wilkinson is a 48 y.o. female presenting for a follow-up appointment to address the previously established treatment goal of increasing coping skills.Today's appointment was a telepsychological visit. Kara Wilkinson provided verbal consent for today's telepsychological appointment and she is aware she is responsible for securing confidentiality on her end of the session. Prior to proceeding with today's appointment, Kara Wilkinson's physical location at the time of this appointment was obtained as well a phone number she could be reached at in the event of technical difficulties. Kara Wilkinson and this provider participated in today's telepsychological service.   This provider conducted a brief check-in. Kara Wilkinson shared about her husband's recent surgery, noting he has various health issues. Associated thoughts and feelings were processed. She also discussed ongoing issues with social security. Regarding emotional eating behaviors, she acknowledged she will reward herself with food. To help improve focus on herself, psychoeducation provided regarding faux self-care and self-care that allows her to maintain physical, emotional, and mental well-being, including its impact on eating habits. Briefly discussed steps for setting boundaries for self-care. For homework, she was encouraged to identify what grounds  and fulfills her. Overall, Kara Wilkinson was receptive to today's appointment as evidenced by openness to sharing, responsiveness to feedback, and willingness to implement discussed strategies .  Mental Status Examination:  Appearance: neat Behavior:  appropriate to circumstances Mood: neutral Affect: mood congruent Speech: WNL Eye Contact: appropriate Psychomotor Activity: WNL Gait: unable to assess Thought Process: linear, logical, and goal directed and no evidence or endorsement of suicidal, homicidal, and self-harm ideation, plan and intent  Thought Content/Perception: no hallucinations, delusions, bizarre thinking or behavior endorsed or observed Orientation: AAOx4 Memory/Concentration: intact Insight: good Judgment: good  Interventions:  Conducted a brief chart review Provided empathic reflections and validation Reviewed content from the previous session Employed supportive psychotherapy interventions to facilitate reduced distress and to improve coping skills with identified stressors Psychoeducation provided regarding self-care  DSM-5 Diagnosis(es): F50.89 Other Specified Feeding or Eating Disorder, Emotional Eating Behaviors and 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 Kara Wilkinson has demonstrated progress in her goal as evidenced by increased awareness of hunger patterns, increased awareness of triggers for emotional eating behaviors, and reduction in emotional eating behaviors. Kara Wilkinson also continues to demonstrate willingness to engage in learned skill(s).   Plan: The next appointment is scheduled for 01/23/2024 at 2pm, which will be via MyChart Video Visit. The next session will focus on working towards the established treatment goal.   Wyatt Fire, PsyD

## 2024-02-19 ENCOUNTER — Other Ambulatory Visit: Payer: 59

## 2024-02-20 NOTE — Progress Notes (Signed)
 Annual Comprehensive Physical Exam   Patient Care Team: Perri Ronal PARAS, MD as PCP - General (Internal Medicine)  Visit Date: 02/23/24   Chief Complaint  Patient presents with   Annual Exam   Subjective:  Patient: Kara Wilkinson, Female DOB: 1976-03-16, 48 y.o. MRN: [id number] Vitals:   02/23/24 1500  BP: 120/80   Kara Wilkinson is a 48 y.o. Female who presents today for her Annual Comprehensive Physical Exam. Patient has Hypersomnolence; OSA (obstructive sleep apnea); Morbid obesity (HCC); Vitamin D  deficiency; Prediabetes; Vitamin B12 deficiency; BMI 40.0-44.9, adult (HCC); Low HDL (under 40); Abnormal craving; Other hyperlipidemia; BMI 30.0-30.9,adult Current BMI 30.5; Grief; Benign paroxysmal vertigo of left ear; Sensorineural hearing loss, bilateral; Dizziness; and Tinnitus of both ears on their problem list.  She says that she feels well.    She was prescribed Wellbutrin 100 mg by her doctor at [facility] but stopped taking it as it gave her headaches.   History of Hyperlipidemia treated with Rosuvastatin  5 mg daily. 01/27/2024 Lipid panel HDL 35 otherwise WNL.   History of Vertigo treated with alprazolam  0.25 mg twice daily as needed. Vertigo likely die to Meniere's disease.   History of Vitamin D  Deficiency treated with Drisdol  50,000 units weekly. 01/27/2024 Vitamin D , hydroxy 48.    01/27/2024 TSH 4.3 increased from 2.34 a year ago.   Continues to be seen at [facility].   Amoxicillin causes hives.  Nickel causes a rash.   No pregnancies.  No history of accidents or operations.  No hospitalizations.   Patient has seen Dr. Karis, ENT physician and has been diagnosed with Mnire's disease left ear. Recently had an episode of vertigo.    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  Has been seen mostly for respiratory infections through the years.  Seen for vasovagal syncope in April  1998.  Fractured left index finger and 1990.   She had a fall in January 2019 injuring her right ankle and right index finger.  Was seen in the Emergency department.   She has a Mirena device and sees GYN.     She has glucose intolerance. 01/27/2024 HgbA1c 5.6%. Continue with diet,exercise and weight loss. Could be a candidate for GLP-1 medication with her medical issues if she desires.   She had sleep study in 2016 interpreted by Dr. Jude and was found to have severe obstructive sleep apnea.  She did have some PVCs on EKG.  She now uses a CPAP, which helps significantly.   She had COVID-19 in February 2021.  Had COVID-19 again in August 2022.  In December 2021 she presented with benign positional vertigo.  She had vertigo in February 2020 with an acute otitis media.  At that time was treated with Zithromax  and Xanax .  Labs 01/27/2024 Insulin  28.0, HDL 35, Otherwise WNL.   03/22/2024 Coronary calcium  score: 0.    01/15/2024 Mammogram normal Repeat in one year.    04/23/2023 Colonoscopy The perianal and digital rectal examinations were normal. A 3 mm polyp was found in the transverse colon. The polyp was sessile. The polyp was removed with a cold snare. Resection and retrieval were complete. Pathology found to be benign but precancerous. A few small-mouthed diverticula were found in the sigmoid colon. Non-bleeding internal hemorrhoids were found during retroflexion. The hemorrhoids were small. Repeat in 7 years.    Vaccine counseling: Influenza vaccine received today. Covid-19 vaccine declined, Pneumonia  and Shingles vaccines due, Hepatitis B vaccine discontinued.   Health Maintenance  Topic Date Due   Pneumococcal Vaccine: 50+ Years (1 of 1 - PCV) Never done   Influenza Vaccine  11/14/2023   COVID-19 Vaccine (3 - 2025-26 season) 03/10/2024 (Originally 12/15/2023)   Zoster Vaccines- Shingrix (1 of 2) 09/08/2024 (Originally 04/21/2023)   Hepatitis B Vaccines 19-59 Average Risk (1 of 3 - 19+  3-dose series) 02/22/2025 (Originally 04/20/1992)   DTaP/Tdap/Td (2 - Td or Tdap) 05/19/2024   Mammogram  01/15/2025   Cervical Cancer Screening (HPV/Pap Cotest)  04/03/2026   Colonoscopy  04/22/2029   HPV VACCINES  Aged Out   Meningococcal B Vaccine  Aged Out   Hepatitis C Screening  Discontinued   HIV Screening  Discontinued    Review of Systems  Constitutional:  Negative for fever and malaise/fatigue.  HENT:  Negative for congestion.   Eyes:  Negative for blurred vision.  Respiratory:  Negative for cough and shortness of breath.   Cardiovascular:  Negative for chest pain, palpitations and leg swelling.  Gastrointestinal:  Negative for vomiting.  Musculoskeletal:  Negative for back pain.  Skin:  Negative for rash.  Neurological:  Negative for loss of consciousness and headaches.   Objective:  Vitals: body mass index is 46.59 kg/m. Today's Vitals   02/23/24 1500  BP: 120/80  Pulse: 67  SpO2: 99%  Weight: 280 lb (127 kg)  Height: 5' 5 (1.651 m)   Physical Exam Vitals and nursing note reviewed.  Constitutional:      General: She is not in acute distress.    Appearance: Normal appearance. She is not ill-appearing or toxic-appearing.  HENT:     Head: Normocephalic and atraumatic.     Right Ear: Hearing, tympanic membrane, ear canal and external ear normal.     Left Ear: Hearing, tympanic membrane, ear canal and external ear normal.     Mouth/Throat:     Pharynx: Oropharynx is clear.  Eyes:     Extraocular Movements: Extraocular movements intact.     Pupils: Pupils are equal, round, and reactive to light.  Neck:     Thyroid: No thyroid mass, thyromegaly or thyroid tenderness.     Vascular: No carotid bruit.  Cardiovascular:     Rate and Rhythm: Normal rate and regular rhythm. No extrasystoles are present.    Pulses:          Dorsalis pedis pulses are 2+ on the right side and 2+ on the left side.     Heart sounds: Normal heart sounds, S1 normal and S2 normal. No murmur  heard.    No friction rub. No gallop.  Pulmonary:     Effort: Pulmonary effort is normal.     Breath sounds: Normal breath sounds. No decreased breath sounds, wheezing, rhonchi or rales.  Chest:     Chest wall: No mass.  Abdominal:     Palpations: Abdomen is soft. There is no hepatomegaly, splenomegaly or mass.     Tenderness: There is no abdominal tenderness.     Hernia: No hernia is present.  Musculoskeletal:     Cervical back: Normal range of motion.     Right lower leg: No edema.     Left lower leg: No edema.  Lymphadenopathy:     Cervical: No cervical adenopathy.     Upper Body:     Right upper body: No supraclavicular adenopathy.     Left upper body: No supraclavicular adenopathy.  Skin:  General: Skin is warm and dry.  Neurological:     General: No focal deficit present.     Mental Status: She is alert and oriented to person, place, and time. Mental status is at baseline.     Sensory: Sensation is intact.     Motor: Motor function is intact. No weakness.     Deep Tendon Reflexes: Reflexes are normal and symmetric.  Psychiatric:        Attention and Perception: Attention normal.        Mood and Affect: Mood normal.        Speech: Speech normal.        Behavior: Behavior normal.        Thought Content: Thought content normal.        Cognition and Memory: Cognition normal.        Judgment: Judgment normal.     Current Outpatient Medications  Medication Instructions   ALPRAZolam  (XANAX ) 0.25 MG tablet TAKE 1 TABLET BY MOUTH 2 TIMES DAILY AS NEEDED FOR vertigo   buPROPion ER (WELLBUTRIN SR) 100 mg, Oral, Daily, Take with naltrexone in the morning daily.   Co Q 10 100 mg, Daily   levonorgestrel (MIRENA) 20 MCG/24HR IUD 1 each,  Once   naltrexone (DEPADE) 25 mg, Oral, Daily, If tolerates 25 mg (0.5 tablet) well after 2 weeks, increase to 25 mg BID   rosuvastatin  (CRESTOR ) 5 mg, Oral, Daily   Vitamin D  (Ergocalciferol ) (DRISDOL ) 50,000 Units, Oral, Weekly   Past  Medical History:  Diagnosis Date   Anemia    Bilateral swelling of feet    Chronic headache    High cholesterol    Hyperlipidemia    Meniere's disease    Multiple food allergies    Sleep apnea    wear c pap   Sleep apnea    Medical/Surgical History Narrative:  Allergic/Intolerant to:  Allergies  Allergen Reactions   Amoxicillin Hives   Avocado Diarrhea   Caffeine     And artificial sweetners   Nickel Other (See Comments)   Past Surgical History:  Procedure Laterality Date   WISDOM TOOTH EXTRACTION     Family History  Problem Relation Age of Onset   Cancer Mother    Stroke Mother    Hyperlipidemia Mother    Hypertension Mother    Breast cancer Mother    Colon polyps Mother    Depression Mother    Sleep apnea Mother    Obesity Mother    Obesity Father    Sleep apnea Father    Cancer Father    Stroke Father    Hyperlipidemia Father    Hypertension Father    Colon polyps Father    Kidney disease Father    Diabetes Father    Bladder Cancer Father    Heart disease Father    Colon cancer Neg Hx    Crohn's disease Neg Hx    Esophageal cancer Neg Hx    Rectal cancer Neg Hx    Stomach cancer Neg Hx    Ulcerative colitis Neg Hx    Family History Narrative: She has a education officer, community from [employer]. She is married and works for [employer]. Does not smoke or consume alcohol. Husband has undergone kidney and pancreas transplant.  Social History   Social History Narrative   Right handed    Does not drink caffeine   Pt wears prescription glasses   Most Recent Health Risks Assessment:   Most Recent Social Determinants  of Health (Including Hx of Tobacco, Alcohol, and Drug Use) SDOH Screenings   Food Insecurity: No Food Insecurity (07/14/2023)  Housing: Low Risk  (07/14/2023)  Transportation Needs: No Transportation Needs (07/14/2023)  Depression (PHQ2-9): Medium Risk (02/23/2024)  Financial Resource Strain: Low Risk  (07/14/2023)  Physical Activity:  Insufficiently Active (07/14/2023)  Social Connections: Socially Integrated (07/14/2023)  Stress: No Stress Concern Present (07/14/2023)  Tobacco Use: Low Risk  (02/23/2024)   Social History   Tobacco Use   Smoking status: Never    Passive exposure: Past (father smoked)   Smokeless tobacco: Never  Vaping Use   Vaping status: Never Used  Substance Use Topics   Alcohol use: Not Currently   Drug use: Never    Most Recent Fall Risk Assessment:    07/23/2022   11:53 AM  Fall Risk   Falls in the past year? 0  Number falls in past yr: 0  Injury with Fall? 0  Risk for fall due to : No Fall Risks  Follow up Falls prevention discussed   Most Recent Anxiety/Depression Screenings:    02/23/2024    3:01 PM 02/23/2024    3:00 PM  PHQ 2/9 Scores  PHQ - 2 Score 0 0  PHQ- 9 Score 5       02/23/2024    3:01 PM  GAD 7 : Generalized Anxiety Score  Nervous, Anxious, on Edge 0  Control/stop worrying 0  Worry too much - different things 0  Trouble relaxing 0  Restless 0  Easily annoyed or irritable 0  Afraid - awful might happen 0  Total GAD 7 Score 0  Anxiety Difficulty Not difficult at all     Results:  Studies Obtained And Personally Reviewed By Me:  03/22/2024 Coronary calcium  score: 0.    01/15/2024 Mammogram normal Repeat in one year.    04/23/2023 Colonoscopy The perianal and digital rectal examinations were normal. A 3 mm polyp was found in the transverse colon. The polyp was sessile. The polyp was removed with a cold snare. Resection and retrieval were complete. Pathology found to be benign but precancerous. A few small-mouthed diverticula were found in the sigmoid colon. Non-bleeding internal hemorrhoids were found during retroflexion. The hemorrhoids were small. Repeat in 7 years.    Labs:  CBC w/ Differential Lab Results  Component Value Date   WBC 7.6 01/27/2024   RBC 4.15 01/27/2024   HGB 12.9 01/27/2024   HCT 39.7 01/27/2024   PLT 253 01/27/2024   MCV 96  01/27/2024   MCH 31.1 01/27/2024   MCHC 32.5 01/27/2024   RDW 12.8 01/27/2024   MPV 10.1 07/23/2022   LYMPHSABS 2.2 01/27/2024   MONOABS 0.8 07/25/2022   BASOSABS 0.0 01/27/2024    Comprehensive Metabolic Panel Lab Results  Component Value Date   NA 139 01/27/2024   K 4.1 01/27/2024   CL 102 01/27/2024   CO2 21 01/27/2024   GLUCOSE 87 01/27/2024   BUN 11 01/27/2024   CREATININE 0.81 01/27/2024   CALCIUM  8.7 01/27/2024   PROT 6.9 01/27/2024   ALBUMIN 4.0 01/27/2024   AST 18 01/27/2024   ALT 23 01/27/2024   ALKPHOS 84 01/27/2024   BILITOT 1.2 01/27/2024   EGFR 88 01/27/2024   GFRNONAA >60 07/25/2022   Lipid Panel  Lab Results  Component Value Date   CHOL 155 01/27/2024   HDL 35 (L) 01/27/2024   LDLCALC 95 01/27/2024   TRIG 142 01/27/2024   A1c Lab Results  Component Value Date  HGBA1C 5.6 01/27/2024    TSH Lab Results  Component Value Date   TSH 4.340 01/27/2024    Assessment & Plan:    She was prescribed Wellbutrin 100 mg by her doctor at [facility] but stopped taking it as it gave her headaches.   Hyperlipidemia: treated with Rosuvastatin  5 mg daily. 01/27/2024 Lipid panel HDL 35 otherwise WNL.   Vertigo: treated with alprazolam  0.25 mg twice daily as needed. Vertigo likely die to Meniere's disease.   Vitamin D  Deficiency: treated with Drisdol  50,000 units weekly. 01/27/2024 Vitamin D , hydroxy 48.    01/27/2024 TSH 4.3 increased from 2.34 a year ago.    TSH recheck in 3 months.  Continues to be seen at [facility].   Patient has seen Dr. Karis, ENT physician and has been diagnosed with Mnire's disease. Recently had an episode.     Glucose intolerance: 01/27/2024 HgbA1c 5.6%. Continue with diet,exercise and weight loss. Could be a candidate for GLP-1 medication with her medical issues if she desires.   She had sleep study in 2016 interpreted by Dr. Jude and was found to have severe obstructive sleep apnea.  She  did have some PVCs on EKG.  She now uses a CPAP, which helps significantly.  03/22/2024 Coronary calcium  score: 0.    01/15/2024 Mammogram normal Repeat in one year.    04/23/2023 Colonoscopy The perianal and digital rectal examinations were normal. A 3 mm polyp was found in the transverse colon. The polyp was sessile. The polyp was removed with a cold snare. Resection and retrieval were complete. Pathology found to be benign but precancerous. A few small-mouthed diverticula were found in the sigmoid colon. Non-bleeding internal hemorrhoids were found during retroflexion. The hemorrhoids were small. Repeat in 7 years.    Vaccine counseling: Influenza vaccine received today. Covid-19 vaccine declined, Pneumonia and Shingles vaccines due, Hepatitis B vaccine discontinued.     Annual Comprehensive Physical Exam done today including the all of the following: Reviewed patient's Family Medical History Reviewed patient's SDOH and reviewed tobacco, alcohol, and drug use.  Reviewed and updated list of patient's medical providers Assessment of cognitive impairment was done Assessed patient's functional ability Established a written schedule for health screening services Health Risk Assessent Completed and Reviewed  Discussed health benefits of physical activity, and encouraged her to engage in regular exercise appropriate for her age and condition.   I,Makayla C Reid,acting as a scribe for Ronal JINNY Hailstone, MD.,have documented all relevant documentation on the behalf of Ronal JINNY Hailstone, MD,as directed by  Ronal JINNY Hailstone, MD while in the presence of Ronal JINNY Hailstone, MD.   I, Ronal JINNY Hailstone, MD, have reviewed all documentation for and agree with the above Annual Wellness Visit documentation.  Ronal JINNY Hailstone, MD Internal Medicine 02/23/2024

## 2024-02-23 ENCOUNTER — Telehealth (INDEPENDENT_AMBULATORY_CARE_PROVIDER_SITE_OTHER): Payer: Self-pay | Admitting: Psychology

## 2024-02-23 ENCOUNTER — Ambulatory Visit: Payer: 59 | Admitting: Internal Medicine

## 2024-02-23 ENCOUNTER — Encounter: Payer: Self-pay | Admitting: Internal Medicine

## 2024-02-23 VITALS — BP 120/80 | HR 67 | Ht 65.0 in | Wt 280.0 lb

## 2024-02-23 DIAGNOSIS — E559 Vitamin D deficiency, unspecified: Secondary | ICD-10-CM

## 2024-02-23 DIAGNOSIS — G4733 Obstructive sleep apnea (adult) (pediatric): Secondary | ICD-10-CM

## 2024-02-23 DIAGNOSIS — Z0000 Encounter for general adult medical examination without abnormal findings: Secondary | ICD-10-CM | POA: Diagnosis not present

## 2024-02-23 DIAGNOSIS — R7302 Impaired glucose tolerance (oral): Secondary | ICD-10-CM

## 2024-02-23 DIAGNOSIS — Z23 Encounter for immunization: Secondary | ICD-10-CM | POA: Diagnosis not present

## 2024-02-23 DIAGNOSIS — E785 Hyperlipidemia, unspecified: Secondary | ICD-10-CM | POA: Diagnosis not present

## 2024-02-23 DIAGNOSIS — R42 Dizziness and giddiness: Secondary | ICD-10-CM | POA: Diagnosis not present

## 2024-02-23 DIAGNOSIS — F5089 Other specified eating disorder: Secondary | ICD-10-CM

## 2024-02-23 DIAGNOSIS — E538 Deficiency of other specified B group vitamins: Secondary | ICD-10-CM

## 2024-02-23 DIAGNOSIS — E7800 Pure hypercholesterolemia, unspecified: Secondary | ICD-10-CM

## 2024-02-23 DIAGNOSIS — F419 Anxiety disorder, unspecified: Secondary | ICD-10-CM

## 2024-02-23 DIAGNOSIS — Z6841 Body Mass Index (BMI) 40.0 and over, adult: Secondary | ICD-10-CM

## 2024-02-23 DIAGNOSIS — H8102 Meniere's disease, left ear: Secondary | ICD-10-CM

## 2024-02-23 DIAGNOSIS — H8109 Meniere's disease, unspecified ear: Secondary | ICD-10-CM

## 2024-02-23 LAB — POCT URINALYSIS DIP (CLINITEK)
Bilirubin, UA: NEGATIVE
Blood, UA: NEGATIVE
Glucose, UA: NEGATIVE mg/dL
Ketones, POC UA: NEGATIVE mg/dL
Leukocytes, UA: NEGATIVE
Nitrite, UA: NEGATIVE
POC PROTEIN,UA: NEGATIVE
Spec Grav, UA: 1.01 (ref 1.010–1.025)
Urobilinogen, UA: 0.2 U/dL
pH, UA: 6.5 (ref 5.0–8.0)

## 2024-02-23 NOTE — Progress Notes (Signed)
  Office: [phone]  /  Fax: [phone]    Date: February 23, 2024  Appointment Start Time: 2:00pm Duration: 34 minutes Provider: Wyatt Fire, Psy.D. Type of Session: Individual Therapy  Location of Patient: Home (private location) Location of Provider: [facility] clinic at [facility] Type of Contact: Telepsychological Visit via MyChart Video Visit  Session Content: Kara Wilkinson is a 48 y.o. female presenting for a follow-up appointment to address the previously established treatment goal of increasing coping skills. Today's appointment was a telepsychological visit. Kara Wilkinson provided verbal consent for today's telepsychological appointment and she is aware she is responsible for securing confidentiality on her end of the session. Prior to proceeding with today's appointment, Kara Wilkinson's physical location at the time of this appointment was obtained as well a phone number she could be reached at in the event of technical difficulties. Kara Wilkinson and this provider participated in today's telepsychological service.   This provider conducted a brief check-in. Kara Wilkinson shared about recent work changes. Given upcoming work-related changes, she has noticed a reduction in stress which has also reduced engagement in emotional eating behaviors. Notably, Kara Wilkinson disclosed concern regarding her adult nephew's mental health. Associated thoughts/feelings were explored and processed. It was reflected her nephew's mental health has brought up concern regarding her work-life balance. Furthermore, this provider shared she will be leaving the practice at the end of this year. Kara Wilkinson's thoughts/feelings were explored and processed. Discussed referral options. She requested to continue seeing this provider at [facility] and declined other referral options at this time. Overall, Kara Wilkinson was receptive to today's appointment as evidenced by openness to sharing, responsiveness to feedback, and willingness  to implement discussed strategies .  Mental Status Examination:  Appearance: neat Behavior: appropriate to circumstances Mood: sad Affect: mood congruent; tearful when sharing about her nephew Speech: WNL Eye Contact: intermittent at times Psychomotor Activity: WNL Gait: unable to assess Thought Process: linear, logical, and goal directed and no evidence or endorsement of suicidal, homicidal, and self-harm ideation, plan and intent  Thought Content/Perception: no hallucinations, delusions, bizarre thinking or behavior endorsed or observed Orientation: AAOx4 Memory/Concentration: intact Insight: good Judgment: good  Interventions:  Conducted a brief chart review Provided empathic reflections and validation Provided positive reinforcement Employed supportive psychotherapy interventions to facilitate reduced distress and to improve coping skills with identified stressors Discussed termination  DSM-5 Diagnosis(es): F50.89 Other Specified Feeding or Eating Disorder, Emotional Eating Behaviors and 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 Kara Wilkinson has demonstrated progress in her goal as evidenced by increased awareness of hunger patterns, increased awareness of triggers for emotional eating behaviors, and reduction in emotional eating behaviors. Kara Wilkinson also continues to demonstrate willingness to engage in learned skill(s).   Plan: The next appointment is scheduled for 03/08/2024 at 2:30pm, which will be via MyChart Video Visit. The next session will focus on working towards the established treatment goal. Additionally, she provided verbal consent for this provider to place a referral with [facility].    Wyatt Fire, PsyD

## 2024-02-23 NOTE — Patient Instructions (Addendum)
 TSH still falls within normal limits but has increased. Let's recheck in 3 months. Flu vaccine given today. Defer pneumococcal vaccine to another time. Continue current meds.

## 2024-02-25 ENCOUNTER — Other Ambulatory Visit (HOSPITAL_BASED_OUTPATIENT_CLINIC_OR_DEPARTMENT_OTHER): Payer: Self-pay

## 2024-02-25 ENCOUNTER — Encounter (INDEPENDENT_AMBULATORY_CARE_PROVIDER_SITE_OTHER): Payer: Self-pay | Admitting: Physician Assistant

## 2024-02-25 ENCOUNTER — Ambulatory Visit (INDEPENDENT_AMBULATORY_CARE_PROVIDER_SITE_OTHER): Payer: Self-pay | Admitting: Physician Assistant

## 2024-02-25 VITALS — BP 164/71 | HR 64 | Temp 98.2°F | Ht 65.0 in | Wt 275.0 lb

## 2024-02-25 DIAGNOSIS — G4733 Obstructive sleep apnea (adult) (pediatric): Secondary | ICD-10-CM

## 2024-02-25 DIAGNOSIS — E559 Vitamin D deficiency, unspecified: Secondary | ICD-10-CM

## 2024-02-25 DIAGNOSIS — R7303 Prediabetes: Secondary | ICD-10-CM

## 2024-02-25 DIAGNOSIS — E88819 Insulin resistance, unspecified: Secondary | ICD-10-CM

## 2024-02-25 DIAGNOSIS — Z6841 Body Mass Index (BMI) 40.0 and over, adult: Secondary | ICD-10-CM

## 2024-02-25 MED ORDER — TIRZEPATIDE-WEIGHT MANAGEMENT 2.5 MG/0.5ML ~~LOC~~ SOAJ
2.5000 mg | SUBCUTANEOUS | 0 refills | Status: DC
  Filled 2024-02-25: qty 2, 28d supply, fill #0

## 2024-02-25 NOTE — Progress Notes (Signed)
 SUBJECTIVE: Discussed the use of AI scribe software for clinical note transcription with the patient, who gave verbal consent to proceed.  Chief Complaint: Obesity  Interim History: She is down 1 lb since her last visit.  Down 12 lbs overall   Kara Wilkinson is here to discuss her progress with her obesity treatment plan. She is on the Category 3 Plan and states she is following her eating plan approximately 50 % of the time. She states she is exercising working in garage   at home.  The patient is a 48 year old with obesity who presents for follow-up of her obesity treatment plan.  She is currently on a category three obesity treatment plan and adheres to it approximately 50% of the time. Since her last visit, she has lost one pound, totaling a weight loss of 12 pounds.  She has obstructive sleep apnea and uses CPAP therapy and remains symptomatic with daytime sleepiness, frequent nighttime awakenings and fatigue.  She has a history of prediabetes, vitamin D  deficiency, and hyperlipidemia. These conditions are mentioned as part of her overall health profile, but there is no further discussion of specific symptoms or treatments for these conditions during this visit.  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  OBJECTIVE: Visit Diagnoses: Problem List Items Addressed This Visit     OSA (obstructive sleep apnea) - Primary   Relevant Medications   tirzepatide  (ZEPBOUND ) 2.5 MG/0.5ML Pen   Morbid obesity (HCC)   Relevant Medications   tirzepatide  (ZEPBOUND ) 2.5 MG/0.5ML Pen   Vitamin D  deficiency   Prediabetes   Vitamin B12 deficiency   Other Visit Diagnoses       Pure hypercholesterolemia         BMI 45.0-49.9, adult (HCC) Current BMI 45.9       Relevant Medications   tirzepatide  (ZEPBOUND ) 2.5 MG/0.5ML Pen      Obesity Management with a focus on weight loss. Currently on a categorically three  plan, following it approximately 50% of the time. Total weight loss of 12 pounds, with a recent loss of 1 pound since the last visit. Discussed treatment options if weight loss struggles continue. Insurance may cover Zepbound  under the diagnosis of obstructive sleep apnea. Discussed the administration of Zepbound , including injection sites, technique, and potential side effects such as nausea, constipation, and rare gallbladder issues. Emphasized the importance of hydration, protein intake, and portion control. Discussed the potential for dose adjustment based on response and muscle mass preservation. Highlighted the potential for significant weight loss and improved quality of life with Zepbound , as seen in other patients. - Start Zepbound  2.5 mg weekly injection for primary indication of OSA using CPAP . - Instructed on proper injection technique and site selection. - Advised on hydration and protein intake. - Instructed to monitor for side effects such as nausea and constipation. - Will consider antinausea medication if significant nausea occurs. - Will schedule follow-up appointment for December 3rd at 2:30 PM.  Obstructive sleep apnea Managed with CPAP. Reports ongoing symptoms of daytime somnolence, fatigue and frequent night time awakenings. Sleep study showed severe OSA with 96.2 apneic events per hour with mean duration of 19.7 sec.   Intensive lifestyle modifications are the first line treatment for this issue. We discussed several lifestyle modifications today and she will continue to work on diet, exercise and weight loss efforts. We will continue to monitor. Orders and follow up as documented in  patient record. Educated pt that OSA is a cause of systemic hypertension and is associated with an increased incidence of stroke, heart failure, atrial fibrillation, and coronary heart disease.  Severe OSA increases all-cause mortality and cardiovascular mortality.  -Goal: Treatment of OSA via CPAP  compliance and weight loss. Plasma ghrelin levels (appetite or "hunger hormone") are significantly higher in OSA patients than in BMI-matched controls, but decrease to levels similar to those of obese patients without OSA after CPAP treatment.  Weight loss improves OSA by several mechanisms, including reduction in fatty tissue in the throat (i.e. parapharyngeal fat) and the tongue. Loss of abdominal fat increases mediastinal traction on the upper airway making it less likely to collapse during sleep. Studies have also shown that compliance with CPAP treatment improves leptin imbalance. Plan:  Start Zepbound  2.5 mg weekly for OSA with continued severe symptoms.  Intensive lifestyle modifications are the first line treatment for this issue. We discussed several lifestyle modifications today and she will continue to work on diet, exercise and weight loss efforts. We will continue to monitor. Orders and follow up as documented in patient record.  Meds ordered this encounter  Medications   tirzepatide  (ZEPBOUND ) 2.5 MG/0.5ML Pen    Sig: Inject 2.5 mg into the skin once a week.    Dispense:  2 mL    Refill:  0    Prediabetes/Insulin  resistance Last A1c was 5.6-at goal/improved. Insulin  28.0- significant elevation/not at goal.   Medication(s): None Polyphagia:Yes  Her HOMA-IR is 6.0 which is significantly elevated. Optimal level < 1.9. This is complex condition associated with genetics, ectopic fat and lifestyle factors. Insulin  resistance may result in weight gain, abnormal cravings (particularly for carbs) and fatigue. This may result in additional weight gain and lead to pre-diabetes and diabetes if untreated.   Lab Results  Component Value Date   HGBA1C 5.6 01/27/2024   Lab Results  Component Value Date   INSULIN  28.0 (H) 01/27/2024   INSULIN  21.8 02/12/2023   INSULIN  41.4 (H) 09/11/2022   Lab Results  Component Value Date   GLUCOSE 87 01/27/2024   GLUCOSE 79 05/17/2014    We  reviewed treatment options which include losing 7 to 10% of body weight, increasing physical activity to a 150 minutes a week of moderate intensity.She may also be a candidate for pharmacoprophylaxis with metformin  or incretin mimetic.   Plan: She did not tolerate metformin .  Zepbound  should also improve this condition .   Continue working on nutrition plan to decrease simple carbohydrates, increase lean proteins and exercise to promote weight loss, improve glycemic control and prevent progression to Type 2 diabetes.    Vitamin D  Deficiency Vitamin D  is at goal of 50.  Most recent vitamin D  level was 48.0. She is on  prescription ergocalciferol  50,000 IU weekly. No N/V or muscle weakness with Ergocalciferol  Lab Results  Component Value Date   VD25OH 48.0 01/27/2024   VD25OH 82 06/30/2023   VD25OH 56.9 02/12/2023    Plan: Continue  prescription ergocalciferol  50,000 IU weekly Low vitamin D  levels can be associated with adiposity and may result in leptin resistance and weight gain. Also associated with fatigue.  Currently on vitamin D  supplementation without any adverse effects such as nausea, vomiting or muscle weakness.   Vitals Temp: 98.2 F (36.8 C) BP: (!) 164/71 Pulse Rate: 64 SpO2: 100 %   Anthropometric Measurements Height: 5' 5 (1.651 m) Weight: 275 lb (124.7 kg) BMI (Calculated): 45.76 Weight at Last Visit: 276 lb Weight  Lost Since Last Visit: 1 lb Weight Gained Since Last Visit: 0 Starting Weight: 287 lb Total Weight Loss (lbs): 12 lb (5.443 kg) Peak Weight: 287 lb   Body Composition  Body Fat %: 54.3 % Fat Mass (lbs): 149.8 lbs Muscle Mass (lbs): 119.6 lbs Visceral Fat Rating : 18   Other Clinical Data Fasting: No Labs: No Today's Visit #: 18 Starting Date: 09/11/22     ASSESSMENT AND PLAN:  Diet: Kara Wilkinson is currently in the action stage of change. As such, her goal is to continue with weight loss efforts. She has agreed to Category 3  Plan.  Exercise: Kara Wilkinson has been instructed to work up to a goal of 150 minutes of combined cardio and strengthening exercise per week, to try a geriatric exercise plan, and that some exercise is better than none for weight loss and overall health benefits.   Behavior Modification:  We discussed the following Behavioral Modification Strategies today: increasing lean protein intake, decreasing simple carbohydrates, increasing vegetables, increase H2O intake, increase high fiber foods, meal planning and cooking strategies, holiday eating strategies, avoiding temptations, and planning for success. We discussed various medication options to help Kara Wilkinson with her weight loss efforts and we both agreed to start Zepbound  2.5 mg weekly for primary indication of OSA.  Return in about 3 weeks (around 03/17/2024).Kara Wilkinson She was informed of the importance of frequent follow up visits to maximize her success with intensive lifestyle modifications for her multiple health conditions.  Attestation Statements:   Reviewed by clinician on day of visit: allergies, medications, problem list, medical history, surgical history, family history, social history, and previous encounter notes.   Time spent on visit including pre-visit chart review and post-visit care and charting was 46 minutes.    Kara Ikner, PA-C

## 2024-02-26 ENCOUNTER — Telehealth (INDEPENDENT_AMBULATORY_CARE_PROVIDER_SITE_OTHER): Payer: Self-pay | Admitting: *Deleted

## 2024-02-26 NOTE — Telephone Encounter (Signed)
 PA SUBMITTED VIA [employer]  Kara Wilkinson (Key: [facility])  Your information has been sent to [facility].

## 2024-03-02 ENCOUNTER — Telehealth (INDEPENDENT_AMBULATORY_CARE_PROVIDER_SITE_OTHER): Payer: Self-pay | Admitting: Physician Assistant

## 2024-03-02 NOTE — Telephone Encounter (Signed)
 Good morning!  [employer] called to inform that the Determination for Zepbound  is still denied after being reviewed by their medical director.   Case ID [id number]

## 2024-03-03 NOTE — Telephone Encounter (Signed)
Message addressed in previous encounter

## 2024-03-03 NOTE — Telephone Encounter (Signed)
 MEDICATION WAS DENIED. CALLED THE OFFICE AND STATED MEDICATION WAS STILL DENIED AFTER MEDICAL REVIEW. PATIENT SENT MYCHART MESSAGE

## 2024-03-05 ENCOUNTER — Telehealth: Payer: Self-pay

## 2024-03-05 NOTE — Telephone Encounter (Signed)
 Called and spoke to pt - pt's CPAP is not updated on [facility]. Pt states that she is still using it every night and has a compliance report on her phone that she can share with us  through MyChart.

## 2024-03-08 ENCOUNTER — Encounter (INDEPENDENT_AMBULATORY_CARE_PROVIDER_SITE_OTHER): Payer: Self-pay

## 2024-03-08 ENCOUNTER — Telehealth (INDEPENDENT_AMBULATORY_CARE_PROVIDER_SITE_OTHER): Payer: Self-pay | Admitting: Psychology

## 2024-03-08 ENCOUNTER — Encounter (INDEPENDENT_AMBULATORY_CARE_PROVIDER_SITE_OTHER): Admitting: Psychology

## 2024-03-08 ENCOUNTER — Ambulatory Visit: Payer: 59 | Admitting: Adult Health

## 2024-03-08 ENCOUNTER — Encounter: Payer: Self-pay | Admitting: Adult Health

## 2024-03-08 VITALS — BP 164/91 | HR 65 | Temp 98.2°F | Ht 65.0 in | Wt 283.4 lb

## 2024-03-08 DIAGNOSIS — G4733 Obstructive sleep apnea (adult) (pediatric): Secondary | ICD-10-CM

## 2024-03-08 DIAGNOSIS — G4709 Other insomnia: Secondary | ICD-10-CM

## 2024-03-08 DIAGNOSIS — Z6841 Body Mass Index (BMI) 40.0 and over, adult: Secondary | ICD-10-CM

## 2024-03-08 MED ORDER — ZEPBOUND 2.5 MG/0.5ML ~~LOC~~ SOAJ: 2.5000 mg | SUBCUTANEOUS | 0 refills | Status: DC

## 2024-03-08 NOTE — Telephone Encounter (Signed)
  Office: [phone]  /  Fax: [phone]  Date of Encounter: March 08, 2024  Time of Encounter: 2:30pm Duration of Encounter: 3.5 minute(s) Provider: Wyatt Fire, PsyD  CONTENT: Kara Wilkinson presented for today's appointment via MyChart Video Visit. She shared she was in significant pain since last night, noting a plan to call her gynecologist today. This provider presented the option to reschedule given the circumstances and Kara Wilkinson was receptive and grateful. No evidence or endorsement of safety concerns. All questions/concerns addressed.   PLAN: Kara Wilkinson is scheduled for an appointment on 03/15/2024 at 2:30pm via MyChart Video Visit.

## 2024-03-08 NOTE — Progress Notes (Signed)
 @Patient  ID: Kara Wilkinson, female    DOB: 1975/07/24, 48 y.o.   MRN: [id number]  Chief Complaint  Patient presents with   Obstructive Sleep Apnea    F/U    Referring provider: Perri Ronal PARAS, MD  HPI: 48 year old female followed for very severe obstructive sleep apnea Medical history significant for morbid obesity, hyperlipidemia, prediabetes    TEST/EVENTS : Reviewed 03/08/2024  Split-night sleep study November 20, 2014 showed severe obstructive sleep apnea with AHI at 108.7/hour with an optimal CPAP pressure at 10 cm H2O.    Discussed the use of AI scribe software for clinical note transcription with the patient, who gave verbal consent to proceed.  History of Present Illness Kara Wilkinson is a 48 year old female with severe sleep apnea who presents for a 1 year checkup and evaluation of sleep disturbances.  She has very severe sleep apnea and uses CPAP therapy. She uses her CPAP machine every night for about eight hours. However, she experiences difficulty staying asleep, waking up for one to two hours each night, a problem persisting for approximately six months. She uses a full face mask with her CPAP. CPAP download shows 100% compliance.  Daily average usage at 8 hours.  AHI 2.3/hour. She has not tried any sleep aids or melatonin to address her sleep issues. Her bedtime routine includes going to bed at 10 PM, reading for an hour, and then sleeping from 11 PM to 7 AM. She typically wakes up at 4 AM and takes about an hour to fall back asleep.  Her daily routine involves working from home with extensive screen time for her job. She takes naps in the evening, which can last up to an hour, often until her husband wakes her.   She is exploring weight management options due to her history of prediabetes and escalating weight. She has attempted various weight loss methods and exercise  without success, and her weight continues to increase.  No history of thyroid cancer, multiple  endocrine neoplasia syndrome, pancreatitis, severe liver or kidney disease, and she is not pregnant or planning a pregnancy. Current weight is 283 pounds with a BMI of 47.    Allergies  Allergen Reactions   Amoxicillin Hives   Avocado Diarrhea   Caffeine     And artificial sweetners   Nickel Other (See Comments)    Immunization History  Administered Date(s) Administered   Influenza, Seasonal, Injecte, Preservative Fre 02/21/2023, 02/23/2024   Influenza,inj,Quad PF,6+ Mos 03/19/2019, 02/04/2022   Influenza-Unspecified 03/15/2014   PFIZER(Purple Top)SARS-COV-2 Vaccination 07/24/2019, 08/25/2019   Tdap 05/19/2014    Past Medical History:  Diagnosis Date   Anemia    Bilateral swelling of feet    Chronic headache    High cholesterol    Hyperlipidemia    Meniere's disease    Multiple food allergies    Sleep apnea    wear c pap   Sleep apnea     Tobacco History: Social History   Tobacco Use  Smoking Status Never   Passive exposure: Past (father smoked)  Smokeless Tobacco Never   Counseling given: Not Answered   Outpatient Medications Prior to Visit  Medication Sig Dispense Refill   ALPRAZolam  (XANAX ) 0.25 MG tablet TAKE 1 TABLET BY MOUTH 2 TIMES DAILY AS NEEDED FOR vertigo 60 tablet 1   Coenzyme Q10 (CO Q 10) 100 MG CAPS Take 100 mg by mouth daily.     levonorgestrel (MIRENA) 20 MCG/24HR IUD 1 each by Intrauterine route  once.     rosuvastatin  (CRESTOR ) 5 MG tablet Take 1 tablet (5 mg total) by mouth daily. 90 tablet 3   Vitamin D , Ergocalciferol , (DRISDOL ) 1.25 MG (50000 UNIT) CAPS capsule TAKE 1 CAPSULE BY MOUTH ONCE  WEEKLY 13 capsule 3   tirzepatide  (ZEPBOUND ) 2.5 MG/0.5ML Pen Inject 2.5 mg into the skin once a week. (Patient not taking: Reported on 03/08/2024) 2 mL 0   No facility-administered medications prior to visit.     Review of Systems:   Constitutional:   No  weight loss, night sweats,  Fevers, chills, +fatigue, or  lassitude.  HEENT:   No  headaches,  Difficulty swallowing,  Tooth/dental problems, or  Sore throat,                No sneezing, itching, ear ache, nasal congestion, post nasal drip,   CV:  No chest pain,  Orthopnea, PND, swelling in lower extremities, anasarca, dizziness, palpitations, syncope.   GI  No heartburn, indigestion, abdominal pain, nausea, vomiting, diarrhea, change in bowel habits, loss of appetite, bloody stools.   Resp: No shortness of breath with exertion or at rest.  No excess mucus, no productive cough,  No non-productive cough,  No coughing up of blood.  No change in color of mucus.  No wheezing.  No chest wall deformity  Skin: no rash or lesions.  GU: no dysuria, change in color of urine, no urgency or frequency.  No flank pain, no hematuria   MS:  No joint pain or swelling.  No decreased range of motion.  No back pain.    Physical Exam  BP (!) 164/91   Pulse 65   Temp 98.2 F (36.8 C)   Ht 5' 5 (1.651 m) Comment: Per pt  Wt 283 lb 6.4 oz (128.5 kg)   SpO2 97% Comment: RA  BMI 47.16 kg/m   GEN: A/Ox3; pleasant , NAD, well nourished    HEENT:  [place]/AT,   NOSE-clear, THROAT-clear, no lesions, no postnasal drip or exudate noted.  Class II-III NP airway, tonsils 1-2+.  NECK:  Supple w/ fair ROM; no JVD; normal carotid impulses w/o bruits; no thyromegaly or nodules palpated; no lymphadenopathy.    RESP  Clear  P & A; w/o, wheezes/ rales/ or rhonchi. no accessory muscle use, no dullness to percussion  CARD:  RRR, no m/r/g, tr peripheral edema, pulses intact, no cyanosis or clubbing.  GI:   Soft & nt; nml bowel sounds; no organomegaly or masses detected.   Musco: Warm bil, no deformities or joint swelling noted.   Neuro: alert, no focal deficits noted.    Skin: Warm, no lesions or rashes    Lab Results:Reviewed 03/08/2024   CBC    Component Value Date/Time   WBC 7.6 01/27/2024 0918   WBC 12.2 (H) 07/25/2022 1322   RBC 4.15 01/27/2024 0918   RBC 4.10 07/25/2022 1322   HGB  12.9 01/27/2024 0918   HCT 39.7 01/27/2024 0918   PLT 253 01/27/2024 0918   MCV 96 01/27/2024 0918   MCH 31.1 01/27/2024 0918   MCH 30.0 07/25/2022 1322   MCHC 32.5 01/27/2024 0918   MCHC 32.7 07/25/2022 1322   RDW 12.8 01/27/2024 0918   LYMPHSABS 2.2 01/27/2024 0918   MONOABS 0.8 07/25/2022 1322   EOSABS 0.1 01/27/2024 0918   BASOSABS 0.0 01/27/2024 0918    BMET    Component Value Date/Time   NA 139 01/27/2024 0918   K 4.1 01/27/2024 0918   CL  102 01/27/2024 0918   CO2 21 01/27/2024 0918   GLUCOSE 87 01/27/2024 0918   GLUCOSE 78 07/25/2022 1322   BUN 11 01/27/2024 0918   CREATININE 0.81 01/27/2024 0918   CREATININE 0.77 07/23/2022 1236   CALCIUM  8.7 01/27/2024 0918   GFRNONAA >60 07/25/2022 1322    BNP No results found for: BNP  ProBNP No results found for: PROBNP  Imaging: No results found.  Administration History     None           No data to display          No results found for: NITRICOXIDE      No data to display              Assessment & Plan:   Assessment and Plan Assessment & Plan Obstructive sleep apnea, severe, well-controlled with CPAP   Severe obstructive sleep apnea is well-controlled with consistent CPAP therapy, used approximately eight hours nightly.  CPAP care discussed in detail. Continue CPAP therapy as prescribed.  Patient has very severe COPD with underlying morbid obesity.  Has ongoing daytime sleepiness and fatigue despite good CPAP usage.  We discussed additional treatment options including Zepbound  which is approved for sleep apnea. Patient has severe   sleep apnea related to obesity with BMI >/30  posing significant cardiovascular risks. Patient has failed traditional weight loss measures with caloric deficit and consistent exercise of 150 min/week for >/6 months. Patient will be initiated on Zepbound  (tirzepatide ) for weight management. Zepbound  is the only pharmaceutical treatment approved for  moderate-to-severe OSA in adults who are   obese (BMI >/30). The patient will continue lifestyle modifications, including structured nutrition and physical activity as directed. No other GLP1 therapy will be used simultaneously at this time. The patient does not have any FDA labeled contraindications to this agent, including pregnancy, lactation, hx or family history of medullary thyroid cancer, or multiple endocrine neoplasia type II. Side effect profile has been reviewed with patient. Aware of red flag symptoms to notify of immediately or seek emergency care, including severe nausea/vomiting, inability to pass bowels or gas, severe abdominal pain/tenderness, jaundice.  Patient education on Zepbound  pen administration.   Insomnia, sleep maintenance type  -Daily symptom burden She experiences insomnia with frequent awakenings around 4 AM, persisting for six months. No caffeine intake or prior use of sleep aids. High screen time, low physical activity, and daytime napping may contribute. Start over-the-counter melatonin at a mid-range dose (e.g., 3 mg). Increase morning exposure to natural light, reduce screen time, and increase physical activity. Consider shortening or avoiding naps to improve nighttime sleep continuity.  Morbid obesity (MBI 47) with associated prediabetes and sleep apnea  -ongoing weight gain Obesity with prediabetes and severe obstructive sleep apnea. Previous weight loss attempts were unsuccessful.  Continue with healthy weight loss and increased exercise.    Elevated blood pressure- follow up with PCP.   Plan  Patient Instructions  Keep up good work  Wear CPAP At bedtime   Do not drive if sleepy  Begin Zepbound  injection 2.5mg  weekly.  Work on healthy weight loss  Follow up in 6 weeks and As needed  -Virtual - Friday        Madelin Stank, NP 03/08/2024  I spent 33   minutes dedicated to the care of this patient on the date of this encounter to include pre-visit review  of records, face-to-face time with the patient discussing conditions above, post visit ordering of testing, clinical documentation with the  electronic health record, making appropriate referrals as documented, and communicating necessary findings to members of the patients care team.

## 2024-03-08 NOTE — Progress Notes (Signed)
 Entered in error. - Dr. Sharron

## 2024-03-08 NOTE — Patient Instructions (Signed)
 Keep up good work  Wear CPAP At bedtime   Do not drive if sleepy  Begin Zepbound  injection 2.5mg  weekly.  Work on healthy weight loss  Follow up in 6 weeks and As needed  -Virtual - Friday

## 2024-03-09 ENCOUNTER — Telehealth (INDEPENDENT_AMBULATORY_CARE_PROVIDER_SITE_OTHER): Payer: Self-pay | Admitting: Psychology

## 2024-03-09 NOTE — Telephone Encounter (Signed)
  Office: [phone]  /  Fax: [phone]  Date of Call: March 09, 2024  Duration of Call: 5 minute(s) Provider: Wyatt Fire, PsyD  CONTENT: This provider called Kara Wilkinson to check-in regarding pain. She stated she is scheduled for an ultrasound today, noting she is certain it is an ovarian cyst. Associated thoughts/feelings were processed. No evidence or endorsement of any safety concerns.  PLAN: Kara Wilkinson is scheduled for an appointment on 03/15/2024 at 2:30pm. No further follow-up planned by this provider.

## 2024-03-12 ENCOUNTER — Other Ambulatory Visit: Payer: Self-pay | Admitting: Internal Medicine

## 2024-03-15 ENCOUNTER — Telehealth (INDEPENDENT_AMBULATORY_CARE_PROVIDER_SITE_OTHER): Admitting: Psychology

## 2024-03-15 DIAGNOSIS — F5089 Other specified eating disorder: Secondary | ICD-10-CM | POA: Diagnosis not present

## 2024-03-15 DIAGNOSIS — F419 Anxiety disorder, unspecified: Secondary | ICD-10-CM

## 2024-03-15 NOTE — Progress Notes (Signed)
  Office: [phone]  /  Fax: [phone]    Date: March 15, 2024  Appointment Start Time: 2:31pm Duration: 34 minutes Provider: Wyatt Fire, Psy.D. Type of Session: Individual Therapy  Location of Patient: Home (private location) Location of Provider: Provider's Home (private office) Type of Contact: Telepsychological Visit via MyChart Video Visit  Session Content: Eli is a 48 y.o. female presenting for a follow-up appointment to address the previously established treatment goal of increasing coping skills. Today's appointment was a telepsychological visit. Delon provided verbal consent for today's telepsychological appointment and she is aware she is responsible for securing confidentiality on her end of the session. Prior to proceeding with today's appointment, Jenisha's physical location at the time of this appointment was obtained as well a phone number she could be reached at in the event of technical difficulties. Mishell and this provider participated in today's telepsychological service.   This provider conducted a brief check-in. Tekisha shared she is feeling better, noting the pain was secondary to an IUD-related issue. Discussed recent holiday, including food intake. She described making better choices and engaging in portion control in comparison prior holidays. Explored what Hae feels helped with the aforementioned. Discussed what she feels she could do differently during future holidays as it relates to eating. Overall, Cerena was receptive to today's appointment as evidenced by openness to sharing, responsiveness to feedback, and willingness to continue engaging in learned skills.  Mental Status Examination:  Appearance: neat Behavior: appropriate to circumstances Mood: neutral Affect: mood congruent Speech: WNL Eye Contact: appropriate Psychomotor Activity: WNL Gait: unable to assess Thought Process: linear, logical, and goal directed and no  evidence or endorsement of suicidal, homicidal, and self-harm ideation, plan and intent  Thought Content/Perception: no hallucinations, delusions, bizarre thinking or behavior endorsed or observed Orientation: AAOx4 Memory/Concentration: intact Insight: good Judgment: good  Interventions:  Conducted a brief chart review Provided empathic reflections and validation Reviewed content from the previous session Provided positive reinforcement Employed supportive psychotherapy interventions to facilitate reduced distress and to improve coping skills with identified stressors  DSM-5 Diagnosis(es): F50.89 Other Specified Feeding or Eating Disorder, Emotional Eating Behaviors and 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 Audra has demonstrated progress in her goal as evidenced by increased awareness of hunger patterns, increased awareness of triggers for emotional eating behaviors, and reduction in emotional eating behaviors. Kamaiya also continues to demonstrate willingness to engage in learned skill(s).   Plan: The next appointment is scheduled for 03/29/2024 at 2:30pm, which will be via MyChart Video Visit. The next session will focus on working towards the established treatment goal. Annaleigh will complete new patient paperwork with [facility].    Wyatt Fire, PsyD

## 2024-03-16 NOTE — Progress Notes (Unsigned)
 TeleHealth Visit:  This visit was completed with telemedicine (audio/video) technology. Maghen has verbally consented to this TeleHealth visit. The patient is located at home, the provider is located at the [facility] office in [place]. The participants in this visit include the listed provider and patient. The visit was conducted today via MyChart video.    SUBJECTIVE: Discussed the use of AI scribe software for clinical note transcription with the patient, who gave verbal consent to proceed.  Chief Complaint: Obesity  Interim History: She is seen by telemedicine visit today.    She is down 1 lb since her last visit.   Varnell is here to discuss her progress with her obesity treatment plan. She is on the Category 3 Plan and states she is following her eating plan approximately 70 % of the time. She states she is not exercising  SUANNE MINAHAN is a 48 year old female with obesity, prediabetes, and severe obstructive sleep apnea who presents for follow-up of her obesity treatment plan.  She has a history of prediabetes, insulin  resistance, and severe obstructive sleep apnea. Despite excellent compliance with CPAP therapy, she experiences ongoing daytime somnolence and short sleep duration. Her sleep study in 2016 showed an AHI of 108.7 events per hour, with optimal CPAP pressure at 10 cm H2O. She reports that even while on CPAP, she continues to experience daytime sleepiness and can fall asleep easily if she sits still.  She has a history of vitamin D  deficiency and is currently taking ergocalciferol  50,000 units once weekly. She also takes rosuvastatin  5 mg daily for hyperlipidemia and CoQ10 100 mg daily while on statin therapy.  Recently, she had an emergency removal of her Mirena IUD due to severe pain, which was initially suspected to be an ovarian cyst. An ultrasound revealed the IUD had moved and was causing discomfort. The removal occurred the week before Thanksgiving, and she  reports ongoing tenderness but significant improvement in pain.  She recently experienced a gastrointestinal illness, which she suspects she contracted during Thanksgiving. This has affected her appetite, and she reports eating minimally due to nausea. She is focusing on staying hydrated.   Her social history includes spending time with family during holidays. She follows dietary advice to manage her weight, such as portion control and prioritizing protein intake during meals. She did well over Thanksgiving with this approach and plans to continue this through the holiday season.   OBJECTIVE: Visit Diagnoses: Problem List Items Addressed This Visit     OSA (obstructive sleep apnea) - Primary   Morbid obesity (HCC)   Vitamin D  deficiency   Prediabetes   Other Visit Diagnoses       Insulin  resistance         BMI 45.0-49.9, adult (HCC) Current BMI 45.8         Obesity Management is ongoing with a focus on weight loss and appetite control. Zepbound  is being considered for primary indication of Severe OSA with anticipated weight management benefits as well, pending insurance approval. Previous attempts with topiramate  were unsuccessful due to exacerbation of headaches. She did not tolerate metformin  due to GI upset.  She is motivated to continue weight management efforts, especially during upcoming holidays. - Await insurance approval for Zepbound  for primary indication of Severe OSA. - If approved, start Zepbound  after full recovery from current GI illness. - Advised on dietary modifications: small frequent meals, avoid excessive fatty foods, and stay hydrated. - Reinforced portion control strategies during holiday meals.  Severe obstructive sleep  apnea AHI of 108.7 events per hour. CPAP therapy is used with optimal pressure at 10 cm H2O. Despite CPAP, she experiences daytime somnolence and difficulty returning to sleep after waking. Zepbound  is being pursued for additional management due  to the severity of sleep apnea. Intensive lifestyle modifications are the first line treatment for this issue. We discussed several lifestyle modifications today and she will continue to work on diet, exercise and weight loss efforts. We will continue to monitor. Orders and follow up as documented in patient record. Educated pt that OSA is a cause of systemic hypertension and is associated with an increased incidence of stroke, heart failure, atrial fibrillation, and coronary heart disease.  Severe OSA increases all-cause mortality and cardiovascular mortality.  -Goal: Treatment of OSA via CPAP compliance and weight loss. Plasma ghrelin levels (appetite or "hunger hormone") are significantly higher in OSA patients than in BMI-matched controls, but decrease to levels similar to those of obese patients without OSA after CPAP treatment.  Weight loss improves OSA by several mechanisms, including reduction in fatty tissue in the throat (i.e. parapharyngeal fat) and the tongue. Loss of abdominal fat increases mediastinal traction on the upper airway making it less likely to collapse during sleep. Studies have also shown that compliance with CPAP treatment improves leptin imbalance. - Continue CPAP therapy. - Await insurance approval for Zepbound  for additional management of sleep apnea.  Prediabetes Last A1c was 5.6- improved, Insulin  28.0- significant elevation, not at goal.   Medication(s): None Polyphagia:Yes Lab Results  Component Value Date   HGBA1C 5.6 01/27/2024   HGBA1C 5.4 02/12/2023   HGBA1C 6.0 (H) 09/11/2022   Lab Results  Component Value Date   INSULIN  28.0 (H) 01/27/2024   INSULIN  21.8 02/12/2023   INSULIN  41.4 (H) 09/11/2022  Her HOMA-IR is 6.0 which is significantly elevated. Optimal level < 1.9. This is complex condition associated with genetics, ectopic fat and lifestyle factors. Insulin  resistance may result in weight gain, abnormal cravings (particularly for carbs) and fatigue.  This may result in additional weight gain and lead to pre-diabetes and diabetes if untreated.  She did not tolerate metformin  due to GI upset.   Plan:  Hopefully to obtain Zepbound  for primary indication of OSA, but should also help with glycemic control and insulin  resistance.  Continue working on nutrition plan to decrease simple carbohydrates, increase lean proteins and exercise to promote weight loss, improve glycemic control and prevent progression to Type 2 diabetes.     Hyperlipidemia Managed with rosuvastatin  5 mg daily. CoQ10 is taken to mitigate potential statin side effects. LDL at goal. HDL below goals. Trig- at goal, but not optimized.  Last lipids Lab Results  Component Value Date   CHOL 155 01/27/2024   HDL 35 (L) 01/27/2024   LDLCALC 95 01/27/2024   TRIG 142 01/27/2024   CHOLHDL 3.7 06/30/2023   Plan: Continue to work on nutrition plan -decreasing simple carbohydrates, increasing lean proteins, decreasing saturated fats and cholesterol , avoiding trans fats and exercise as able to promote weight loss, improve lipids and decrease cardiovascular risks. - Continue rosuvastatin  5 mg daily. - Continue CoQ10 100 mg daily.  Vitamin D  deficiency Managed with ergocalciferol  50,000 units weekly. Recent prescription refill was completed by PCP- Dr. Perri. Last vitamin D  Lab Results  Component Value Date   VD25OH 48.0 01/27/2024   Nearing goal but dips below goal when not on supplementation.  Low vitamin D  levels can be associated with adiposity and may result in leptin resistance and  weight gain. Also associated with fatigue.  Currently on vitamin D  supplementation without any adverse effects such as nausea, vomiting or muscle weakness.  - Continue ergocalciferol  50,000 units weekly.  No data recorded Anthropometric Measurements Height: 5' 5 (1.651 m) Weight: 282 lb (127.9 kg) BMI (Calculated): 46.93 Weight at Last Visit: 283 lbs Weight Lost Since Last Visit: 1  lbs Weight Gained Since Last Visit: 0 Starting Weight: 287 lbs Total Weight Loss (lbs): 13 lb (5.897 kg) Peak Weight: 287 lbs   No data recorded Other Clinical Data Today's Visit #: 19 Starting Date: 09/11/22 Comments: mychart visit     ASSESSMENT AND PLAN:  Diet: Talina is currently in the action stage of change. As such, her goal is to continue with weight loss efforts. She has agreed to Category 3 Plan.  Exercise: Freja has been instructed to work up to a goal of 150 minutes of combined cardio and strengthening exercise per week and that some exercise is better than none for weight loss and overall health benefits.   Behavior Modification:  We discussed the following Behavioral Modification Strategies today: increasing lean protein intake, decreasing simple carbohydrates, increasing vegetables, increase H2O intake, increase high fiber foods, meal planning and cooking strategies, holiday eating strategies, avoiding temptations, and planning for success. We discussed various medication options to help Lanore with her weight loss efforts and we both agreed to continue to work on nutritional and behavioral strategies to promote weight loss.  Await follow up from Pulmonary provider on obtaining Zepbound   for primary indication of severe OSA using CPAP.  Return in about 5 weeks (around 04/21/2024).SABRA She was informed of the importance of frequent follow up visits to maximize her success with intensive lifestyle modifications for her multiple health conditions.  Attestation Statements:   Reviewed by clinician on day of visit: allergies, medications, problem list, medical history, surgical history, family history, social history, and previous encounter notes.   Time spent on visit including pre-visit chart review and post-visit care and charting was 35 minutes.    Dela Sweeny, PA-C

## 2024-03-17 ENCOUNTER — Encounter (INDEPENDENT_AMBULATORY_CARE_PROVIDER_SITE_OTHER): Payer: Self-pay | Admitting: Physician Assistant

## 2024-03-17 ENCOUNTER — Telehealth (INDEPENDENT_AMBULATORY_CARE_PROVIDER_SITE_OTHER): Admitting: Physician Assistant

## 2024-03-17 VITALS — Ht 65.0 in | Wt 282.0 lb

## 2024-03-17 DIAGNOSIS — E785 Hyperlipidemia, unspecified: Secondary | ICD-10-CM

## 2024-03-17 DIAGNOSIS — F5089 Other specified eating disorder: Secondary | ICD-10-CM

## 2024-03-17 DIAGNOSIS — R7303 Prediabetes: Secondary | ICD-10-CM

## 2024-03-17 DIAGNOSIS — G4733 Obstructive sleep apnea (adult) (pediatric): Secondary | ICD-10-CM | POA: Diagnosis not present

## 2024-03-17 DIAGNOSIS — E559 Vitamin D deficiency, unspecified: Secondary | ICD-10-CM

## 2024-03-17 DIAGNOSIS — E88819 Insulin resistance, unspecified: Secondary | ICD-10-CM

## 2024-03-17 DIAGNOSIS — Z6841 Body Mass Index (BMI) 40.0 and over, adult: Secondary | ICD-10-CM

## 2024-03-18 ENCOUNTER — Other Ambulatory Visit (HOSPITAL_COMMUNITY): Payer: Self-pay

## 2024-03-18 ENCOUNTER — Telehealth: Payer: Self-pay

## 2024-03-18 NOTE — Telephone Encounter (Signed)
*  Pulm  PA request for Zepbound , previously Primary care tried a PA and was denied, appeal also done by them was denied.   Looking into this.

## 2024-03-19 NOTE — Telephone Encounter (Signed)
 Sent to office: We would be unable to continue trying for coverage at this time as the [employer] [facility] office has already submitted a PA and an Appeal which were both denied. Notes state that documentation of OSA was submitted to plan and still denied. Office may try for a second Appeal but I don't see that they have any denial paperwork in the chart to work off from.

## 2024-03-23 ENCOUNTER — Encounter: Payer: Self-pay | Admitting: Internal Medicine

## 2024-03-23 ENCOUNTER — Ambulatory Visit: Admitting: Internal Medicine

## 2024-03-23 VITALS — BP 120/80 | HR 65 | Temp 99.9°F | Ht 65.0 in

## 2024-03-23 DIAGNOSIS — G4733 Obstructive sleep apnea (adult) (pediatric): Secondary | ICD-10-CM | POA: Diagnosis not present

## 2024-03-23 DIAGNOSIS — R509 Fever, unspecified: Secondary | ICD-10-CM | POA: Diagnosis not present

## 2024-03-23 DIAGNOSIS — R7302 Impaired glucose tolerance (oral): Secondary | ICD-10-CM

## 2024-03-23 DIAGNOSIS — E7800 Pure hypercholesterolemia, unspecified: Secondary | ICD-10-CM

## 2024-03-23 DIAGNOSIS — J029 Acute pharyngitis, unspecified: Secondary | ICD-10-CM

## 2024-03-23 DIAGNOSIS — R5383 Other fatigue: Secondary | ICD-10-CM

## 2024-03-23 LAB — POC COVID19/FLU A&B COMBO
Covid Antigen, POC: NEGATIVE
Influenza A Antigen, POC: NEGATIVE
Influenza B Antigen, POC: NEGATIVE

## 2024-03-23 LAB — POCT RAPID STREP A (OFFICE): Rapid Strep A Screen: NEGATIVE

## 2024-03-23 MED ORDER — AZITHROMYCIN 250 MG PO TABS: ORAL_TABLET | ORAL | 0 refills | Status: AC

## 2024-03-23 NOTE — Progress Notes (Signed)
 Patient Care Team: Perri Ronal PARAS, MD as PCP - General (Internal Medicine)  Visit Date: 03/23/24  Subjective:    Patient ID: Kara Wilkinson , Female   DOB: 1976/02/21, 48 y.o.    MRN: [id number]   48 y.o. Female presents today for Sore throat. Patient has a past medical history of Hyperlipidemia, Vertigo, Vitamin D  deficiency.  She said her throat is very painful. It started yesterday and prevented her from getting sleep. She has a fever or 99 degrees. Denies cough and congestion and said that no one that has been around her has been sick. Influenza, Covid-19 and Strep test were negative.   Vaccine counseling: UTD on influenza vaccine.    Past Medical History:  Diagnosis Date   Anemia    Bilateral swelling of feet    Chronic headache    High cholesterol    Hyperlipidemia    Meniere's disease    Multiple food allergies    Sleep apnea    wear c pap   Sleep apnea      Family History  Problem Relation Age of Onset   Cancer Mother    Stroke Mother    Hyperlipidemia Mother    Hypertension Mother    Breast cancer Mother    Colon polyps Mother    Depression Mother    Sleep apnea Mother    Obesity Mother    Obesity Father    Sleep apnea Father    Cancer Father    Stroke Father    Hyperlipidemia Father    Hypertension Father    Colon polyps Father    Kidney disease Father    Diabetes Father    Bladder Cancer Father    Heart disease Father    Colon cancer Neg Hx    Crohn's disease Neg Hx    Esophageal cancer Neg Hx    Rectal cancer Neg Hx    Stomach cancer Neg Hx    Ulcerative colitis Neg Hx     Social History   Social History Narrative   Right handed    Does not drink caffeine   Pt wears prescription glasses      Review of Systems  Constitutional:  Positive for fever and malaise/fatigue.  HENT:  Positive for sore throat. Negative for congestion.   Respiratory:  Negative for cough.         Objective:   Vitals: BP 120/80   Pulse 65   Temp 99.9  F (37.7 C)   Ht 5' 5 (1.651 m)   SpO2 99%   BMI 46.93 kg/m    Physical Exam Vitals and nursing note reviewed.  Constitutional:      General: She is not in acute distress.    Appearance: Normal appearance. She is not ill-appearing.  HENT:     Head: Normocephalic and atraumatic.     Right Ear: Tympanic membrane, ear canal and external ear normal.     Left Ear: Tympanic membrane, ear canal and external ear normal.     Ears:     Comments: Cerumen in left ear.     Mouth/Throat:     Mouth: Mucous membranes are moist.     Pharynx: Oropharynx is clear. No oropharyngeal exudate or posterior oropharyngeal erythema.     Comments: Pharynx slightly injected.  Pulmonary:     Effort: Pulmonary effort is normal.     Breath sounds: Normal breath sounds. No wheezing, rhonchi or rales.  Lymphadenopathy:     Cervical: No  cervical adenopathy.  Skin:    General: Skin is warm and dry.  Neurological:     Mental Status: She is alert and oriented to person, place, and time. Mental status is at baseline.  Psychiatric:        Mood and Affect: Mood normal.        Behavior: Behavior normal.        Thought Content: Thought content normal.        Judgment: Judgment normal.       Results:    Labs:       Component Value Date/Time   NA 139 01/27/2024 0918   K 4.1 01/27/2024 0918   CL 102 01/27/2024 0918   CO2 21 01/27/2024 0918   GLUCOSE 87 01/27/2024 0918   GLUCOSE 78 07/25/2022 1322   BUN 11 01/27/2024 0918   CREATININE 0.81 01/27/2024 0918   CREATININE 0.77 07/23/2022 1236   CALCIUM  8.7 01/27/2024 0918   PROT 6.9 01/27/2024 0918   ALBUMIN 4.0 01/27/2024 0918   AST 18 01/27/2024 0918   ALT 23 01/27/2024 0918   ALKPHOS 84 01/27/2024 0918   BILITOT 1.2 01/27/2024 0918   GFRNONAA >60 07/25/2022 1322     Lab Results  Component Value Date   WBC 7.6 01/27/2024   HGB 12.9 01/27/2024   HCT 39.7 01/27/2024   MCV 96 01/27/2024   PLT 253 01/27/2024    Lab Results  Component Value  Date   CHOL 155 01/27/2024   HDL 35 (L) 01/27/2024   LDLCALC 95 01/27/2024   TRIG 142 01/27/2024   CHOLHDL 3.7 06/30/2023    Lab Results  Component Value Date   HGBA1C 5.6 01/27/2024     Lab Results  Component Value Date   TSH 4.340 01/27/2024        Assessment & Plan:   Meds ordered this encounter  Medications   azithromycin  (ZITHROMAX ) 250 MG tablet    Sig: Take 2 tablets on day 1, then 1 tablet daily on days 2 through 5    Dispense:  6 tablet    Refill:  0    Low grade fever: She said her throat is very painful. It started yesterday and prevented her from getting sleep. She has a fever of 99 degrees. Denies cough and congestion and said that no one that has been around her has been sick. Influenza, Covid-19 and Strep test were negative.   Azithromycin  250 mg 2 tablets on day one, one tablet daily on days 2-5 prescribed.    Vaccine counseling: UTD on influenza vaccine.    I,Makayla C Reid,acting as a scribe for Ronal JINNY Hailstone, MD.,have documented all relevant documentation on the behalf of Ronal JINNY Hailstone, MD,as directed by  Ronal JINNY Hailstone, MD while in the presence of Ronal JINNY Hailstone, MD.

## 2024-03-24 NOTE — Patient Instructions (Signed)
 Patient tested negative for COVID-19 strep throat and flu today.  I prefer that she take antibiotics for acute pharyngitis.  She will take Zithromax  Z-PAK 2 tabs day 1 followed by 1 tab days 2 through 5.  She will call if not better in 5 to 7 days or sooner if worse.  May take Tylenol  for sore throat pain.  Continue with Zepbound  for BMI of 46 and mild glucose intolerance.  Continue statin medication for pure hypercholesterolemia.  Coronary calcium  score in 2023 was normal at 0.

## 2024-03-29 ENCOUNTER — Telehealth (INDEPENDENT_AMBULATORY_CARE_PROVIDER_SITE_OTHER): Admitting: Psychology

## 2024-03-29 DIAGNOSIS — F5089 Other specified eating disorder: Secondary | ICD-10-CM | POA: Diagnosis not present

## 2024-03-29 DIAGNOSIS — F419 Anxiety disorder, unspecified: Secondary | ICD-10-CM

## 2024-03-29 NOTE — Progress Notes (Signed)
°  Office: [phone]  /  Fax: [phone]    Date: March 29, 2024  Appointment Start Time: 2:33pm Duration: 35 minutes Provider: Wyatt Fire, Psy.D. Type of Session: Individual Therapy  Location of Patient: Home (private location) Location of Provider: [facility] clinic at [facility] Type of Contact: Telepsychological Visit via MyChart Video Visit  Session Content: Kara Wilkinson is a 48 y.o. female presenting for a follow-up appointment to address the previously established treatment goal of increasing coping skills.Today's appointment was a telepsychological visit. Kara Wilkinson provided verbal consent for today's telepsychological appointment and she is aware she is responsible for securing confidentiality on her end of the session. Prior to proceeding with today's appointment, Kara Wilkinson's physical location at the time of this appointment was obtained as well a phone number she could be reached at in the event of technical difficulties. Kara Wilkinson and this provider participated in today's telepsychological service.   This provider conducted a brief check-in. Kara Wilkinson shared she was sick last week, but feels better. She further shared about an upcoming event honoring her maternal grandfather. Kara Wilkinson disclosed experiencing anxiety regarding family dynamics at the upcoming event. Further explored and processed. Session concluded with Kara Wilkinson discussing a belief she gained weight despite engaging in physical activity and eating congruent to her goals with the clinic. Further explored. She agreed to have her husband hide the scale and wait to be weighed at the clinic. Overall, Kara Wilkinson was receptive to today's appointment as evidenced by openness to sharing, responsiveness to feedback, and willingness to continue engaging in learned skills.  Mental Status Examination:  Appearance: neat Behavior: appropriate to circumstances Mood: neutral Affect: mood congruent Speech: WNL Eye Contact:  appropriate Psychomotor Activity: WNL Gait: unable to assess Thought Process: linear, logical, and goal directed and no evidence or endorsement of suicidal, homicidal, and self-harm ideation, plan and intent  Thought Content/Perception: no hallucinations, delusions, bizarre thinking or behavior endorsed or observed Orientation: AAOx4 Memory/Concentration: intact Insight: good Judgment: good  Interventions:  Conducted a brief chart review Provided empathic reflections and validation Reviewed content from the previous session Provided positive reinforcement Employed supportive psychotherapy interventions to facilitate reduced distress and to improve coping skills with identified stressors Engaged pt in thought challenging  DSM-5 Diagnosis(es): F50.89 Other Specified Feeding or Eating Disorder, Emotional Eating Behaviors and F41.9 Unspecified Anxiety Disorder  Treatment Goal & Progress:During the initial appointment with this provider, the following treatment goal was established: increase coping skills. Kara Wilkinson demonstrated progress in her goal as evidenced by increased awareness of hunger patterns, increased awareness of triggers for emotional eating behaviors, and reduction in emotional eating behaviors. Kara Wilkinson also continues to demonstrate willingness to engage in learned skill(s).   Plan: As previously planned, today was Kara Wilkinson's last appointment with this provider. She noted a plan to re-establish care with this provider with [facility] in the new year. Kara Wilkinson is aware of this provider's departure from [facility]. No further follow-up planned by this provider.    Wyatt Fire, PsyD

## 2024-04-14 ENCOUNTER — Encounter (INDEPENDENT_AMBULATORY_CARE_PROVIDER_SITE_OTHER): Payer: Self-pay | Admitting: Physician Assistant

## 2024-04-20 ENCOUNTER — Ambulatory Visit (INDEPENDENT_AMBULATORY_CARE_PROVIDER_SITE_OTHER): Admitting: Adult Health

## 2024-04-20 ENCOUNTER — Encounter (INDEPENDENT_AMBULATORY_CARE_PROVIDER_SITE_OTHER): Payer: Self-pay | Admitting: Adult Health

## 2024-04-20 VITALS — BP 136/79 | HR 82 | Temp 98.4°F | Ht 65.0 in | Wt 276.0 lb

## 2024-04-20 DIAGNOSIS — Z6841 Body Mass Index (BMI) 40.0 and over, adult: Secondary | ICD-10-CM | POA: Diagnosis not present

## 2024-04-20 DIAGNOSIS — G4733 Obstructive sleep apnea (adult) (pediatric): Secondary | ICD-10-CM

## 2024-04-20 DIAGNOSIS — E669 Obesity, unspecified: Secondary | ICD-10-CM

## 2024-04-20 DIAGNOSIS — E88819 Insulin resistance, unspecified: Secondary | ICD-10-CM

## 2024-04-20 NOTE — Progress Notes (Signed)
 "    WEIGHT SUMMARY AND BIOMETRICS  Vitals Temp: 98.4 F (36.9 C) BP: 136/79 Pulse Rate: 82 SpO2: 98 %   Anthropometric Measurements Height: 5' 5 (1.651 m) Weight: 276 lb (125.2 kg) BMI (Calculated): 45.93 Weight at Last Visit: 282lb Weight Lost Since Last Visit: 6lb Weight Gained Since Last Visit: 0lb Starting Weight: 287lb Total Weight Loss (lbs): 11 lb (4.99 kg) Peak Weight: 287lb   Body Composition  Body Fat %: 52.7 % Fat Mass (lbs): 145.6 lbs Muscle Mass (lbs): 124 lbs Total Body Water (lbs): 98.6 lbs Visceral Fat Rating : 18   Other Clinical Data Fasting: No Labs: no Today's Visit #: 20 Starting Date: 09/11/22    Chief Complaint:   OBESITY Kara Wilkinson is here to discuss her progress with her obesity treatment plan.  She is on the the Category 3 Plan and states she is following her eating plan approximately 100 % of the time.  She states she is exercising: none during recovery from acute illness.  Interim History:  Kara Wilkinson recently joined [facility]. The first month was $150 That provided access to virtual provider, tracking intake via APP, and started on semaglutide  She started GLP-1 therapy on 04/14/2024: 8U units that equals 0.2mg  Denies mass in neck, dysphagia, dyspepsia, persistent hoarseness, abdominal pain, or N/V/C  She reports stable appetite She reports frequent contact with [facility] providers- every few days via call/text.  Reviewed Bioimpedance Results with pt: Muscle Mass: +4.4 lbs Adipose Mass: -4.2 lbs  Current weight 276 lbs, goal weight < 200 lbs  She continues to recover from URI Pt appears stable and without distress in office today  Subjective:   1. Insulin  resistance Lab Results  Component Value Date   HGBA1C 5.6 01/27/2024   HGBA1C 5.4 02/12/2023   HGBA1C 6.0 (H) 09/11/2022     Latest Reference Range & Units 09/11/22 08:46 02/12/23 07:58 01/27/24 09:18  INSULIN  2.6 - 24.9 uIU/mL 41.4 (H) 21.8 28.0 (H)  (H): Data is  abnormally high  She started GLP-1 therapy on 04/14/2024: 8U units that equals 0.2mg  Denies mass in neck, dysphagia, dyspepsia, persistent hoarseness, abdominal pain, or N/V/C   Patient was counseled on the importance of maintaining healthy lifestyle habits, including balanced nutrition, regular physical activity, and behavioral modifications, while taking antiobesity medication.   Patient verbalized understanding that medication is an adjunct to, not a replacement for, lifestyle changes and that the long-term success and weight maintenance depend on continued adherence to these strategies.   2. OSA (obstructive sleep apnea) Sleep Study in 2016 - study reviewed in EPIC AHI of 108.7 events per hour, with optimal CPAP pressure at 10 cm H2O. She reports that even while on CPAP She endorses 100% compliance of nightly CPAP  She is hopeful that weight loss will improve sleep and ultimately help her to d/c nightly CPAP  Assessment/Plan:   1. Insulin  resistance (Primary) Continue healthy eating Resume regular exercise once acute URI sx's have resolved. Patient was counseled on the importance of maintaining healthy lifestyle habits, including balanced nutrition, regular physical activity, and behavioral modifications, while taking antiobesity medication.   Patient verbalized understanding that medication is an adjunct to, not a replacement for, lifestyle changes and that the long-term success and weight maintenance depend on continued adherence to these strategies.   2. OSA (obstructive sleep apnea) Continue with weight loss efforts Continue weekly GLP-1 therapy per [facility] provider Patient was counseled on the importance of maintaining healthy lifestyle habits, including balanced nutrition, regular physical activity, and  behavioral modifications, while taking antiobesity medication.   Patient verbalized understanding that medication is an adjunct to, not a replacement for, lifestyle changes and that  the long-term success and weight maintenance depend on continued adherence to these strategies.   3. BMI 45.0-49.9, adult (HCC) Current BMI 46.0 Patient was counseled on the importance of maintaining healthy lifestyle habits, including balanced nutrition, regular physical activity, and behavioral modifications, while taking antiobesity medication.   Patient verbalized understanding that medication is an adjunct to, not a replacement for, lifestyle changes and that the long-term success and weight maintenance depend on continued adherence to these strategies.   Kara Wilkinson is currently in the action stage of change. As such, her goal is to continue with weight loss efforts. She has agreed to the Category 3 Plan.   Exercise goals: No exercise has been prescribed at this time.  Behavioral modification strategies: increasing lean protein intake, decreasing simple carbohydrates, increasing vegetables, increasing water intake, no skipping meals, meal planning and cooking strategies, keeping healthy foods in the home, ways to avoid boredom eating, and planning for success.  Kara Wilkinson has agreed to follow-up with our clinic in 4 weeks. She was informed of the importance of frequent follow-up visits to maximize her success with intensive lifestyle modifications for her multiple health conditions.   Objective:   Blood pressure 136/79, pulse 82, temperature 98.4 F (36.9 C), height 5' 5 (1.651 m), weight 276 lb (125.2 kg), SpO2 98%. Body mass index is 45.93 kg/m.  General: Cooperative, alert, well developed, in no acute distress. HEENT: Conjunctivae and lids unremarkable. Cardiovascular: Regular rhythm.  Lungs: Normal work of breathing. Neurologic: No focal deficits.   Lab Results  Component Value Date   CREATININE 0.81 01/27/2024   BUN 11 01/27/2024   NA 139 01/27/2024   K 4.1 01/27/2024   CL 102 01/27/2024   CO2 21 01/27/2024   Lab Results  Component Value Date   ALT 23 01/27/2024   AST 18  01/27/2024   ALKPHOS 84 01/27/2024   BILITOT 1.2 01/27/2024   Lab Results  Component Value Date   HGBA1C 5.6 01/27/2024   HGBA1C 5.4 02/12/2023   HGBA1C 6.0 (H) 09/11/2022   Lab Results  Component Value Date   INSULIN  28.0 (H) 01/27/2024   INSULIN  21.8 02/12/2023   INSULIN  41.4 (H) 09/11/2022   Lab Results  Component Value Date   TSH 4.340 01/27/2024   Lab Results  Component Value Date   CHOL 155 01/27/2024   HDL 35 (L) 01/27/2024   LDLCALC 95 01/27/2024   TRIG 142 01/27/2024   CHOLHDL 3.7 06/30/2023   Lab Results  Component Value Date   VD25OH 48.0 01/27/2024   VD25OH 82 06/30/2023   VD25OH 56.9 02/12/2023   Lab Results  Component Value Date   WBC 7.6 01/27/2024   HGB 12.9 01/27/2024   HCT 39.7 01/27/2024   MCV 96 01/27/2024   PLT 253 01/27/2024   No results found for: IRON, TIBC, FERRITIN  Attestation Statements:   Reviewed by clinician on day of visit: allergies, medications, problem list, medical history, surgical history, family history, social history, and previous encounter notes.  Time spent on visit including pre-visit chart review and post-visit care and charting was 26 minutes.   I have reviewed the above documentation for accuracy and completeness, and I agree with the above. -  Jamirah Zelaya d. Bathsheba Durrett, NP-C "

## 2024-04-20 NOTE — Addendum Note (Signed)
 Addended by: DELORES CASTOR A on: 04/20/2024 03:44 PM   Modules accepted: Orders

## 2024-04-29 ENCOUNTER — Ambulatory Visit (INDEPENDENT_AMBULATORY_CARE_PROVIDER_SITE_OTHER): Admitting: Otolaryngology

## 2024-04-29 VITALS — BP 132/86 | HR 72

## 2024-04-29 DIAGNOSIS — R42 Dizziness and giddiness: Secondary | ICD-10-CM

## 2024-04-29 DIAGNOSIS — H903 Sensorineural hearing loss, bilateral: Secondary | ICD-10-CM | POA: Diagnosis not present

## 2024-04-29 DIAGNOSIS — H8102 Meniere's disease, left ear: Secondary | ICD-10-CM

## 2024-04-29 NOTE — Progress Notes (Signed)
 Patient ID: Kara Wilkinson, female   DOB: May 29, 1975, 49 y.o.   MRN: [id number]  Follow up: Recurrent dizziness, left ear Mnire's disease, bilateral hearing loss  History of Present Illness Kara Wilkinson is a 49 year old female with left-sided Mnire's disease and bilateral sensorineural hearing loss who presents for routine otolaryngology follow-up.  Over the past six months, she has experienced intermittent mild disequilibrium, described as feeling off balance, without episodes of true vertigo. She denies severe spinning sensations and notes that her imbalance is mild and non-persistent.  She continues to follow a low sodium diet.  Three weeks prior to the visit, she developed an upper respiratory illness with nasal congestion and aural fullness. During this period, she noted her ears felt obstructed and frequently popped, accompanied by jaw popping. Her hearing has remained stable without any acute decline in auditory acuity.  Exam: General: Communicates without difficulty, well nourished, no acute distress. Head: Normocephalic, no evidence injury, no tenderness, facial buttresses intact without stepoff. Face/sinus: No tenderness to palpation and percussion. Facial movement is normal and symmetric. Eyes: PERRL, EOMI. No scleral icterus, conjunctivae clear. Neuro: CN II exam reveals vision grossly intact.  No nystagmus at any point of gaze. Ears: Auricles well formed without lesions.  Ear canals are intact without mass or lesion.  No erythema or edema is appreciated.  The TMs are intact without fluid. Nose: External evaluation reveals normal support and skin without lesions.  Dorsum is intact.  Anterior rhinoscopy reveals congested mucosa over anterior aspect of inferior turbinates and intact septum.  No purulence noted. Oral:  Oral cavity and oropharynx are intact, symmetric, without erythema or edema.  Mucosa is moist without lesions. Neck: Full range of motion without pain.  There is no  significant lymphadenopathy.  No masses palpable.  Thyroid bed within normal limits to palpation.  Parotid glands and submandibular glands equal bilaterally without mass.  Trachea is midline. Neuro:  CN 2-12 grossly intact.    Assessment and Plan Assessment & Plan Mnire's disease, left ear Left ear Mnire's disease remains well-controlled with no severe vertigo episodes and only mild intermittent imbalance. She adheres to a low sodium diet, which has been effective in symptom management. No evidence of acute exacerbation.  - Advised continuation of a low sodium diet to maintain symptom control and prevent progression. - Instructed her to notify the clinic for significant changes in hearing or severe vertigo, as early intervention may be beneficial. - Scheduled follow-up in six months, with instructions to return sooner if new or worsening symptoms occur.  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 No acute issues identified. - Advised her to promptly report any sudden changes in hearing, as early intervention may be possible.

## 2024-04-30 ENCOUNTER — Ambulatory Visit: Admitting: Adult Health

## 2024-05-19 ENCOUNTER — Ambulatory Visit (INDEPENDENT_AMBULATORY_CARE_PROVIDER_SITE_OTHER): Admitting: Physician Assistant

## 2024-05-19 DIAGNOSIS — E559 Vitamin D deficiency, unspecified: Secondary | ICD-10-CM

## 2024-05-19 DIAGNOSIS — E88819 Insulin resistance, unspecified: Secondary | ICD-10-CM

## 2024-05-19 DIAGNOSIS — E7800 Pure hypercholesterolemia, unspecified: Secondary | ICD-10-CM

## 2024-05-19 DIAGNOSIS — F5089 Other specified eating disorder: Secondary | ICD-10-CM

## 2024-05-19 DIAGNOSIS — G4733 Obstructive sleep apnea (adult) (pediatric): Secondary | ICD-10-CM

## 2024-05-24 ENCOUNTER — Other Ambulatory Visit

## 2024-06-01 ENCOUNTER — Ambulatory Visit (INDEPENDENT_AMBULATORY_CARE_PROVIDER_SITE_OTHER): Admitting: Physician Assistant

## 2024-10-28 ENCOUNTER — Ambulatory Visit (INDEPENDENT_AMBULATORY_CARE_PROVIDER_SITE_OTHER): Admitting: Otolaryngology
